# Patient Record
Sex: Female | Born: 1975 | Race: Black or African American | Hispanic: No | Smoking: Former smoker
Health system: Southern US, Community
[De-identification: ages and names within clinical notes are randomized; demographics above are authoritative.]

## PROBLEM LIST (undated history)

## (undated) DIAGNOSIS — D649 Anemia, unspecified: Secondary | ICD-10-CM

## (undated) DIAGNOSIS — R7989 Other specified abnormal findings of blood chemistry: Secondary | ICD-10-CM

## (undated) DIAGNOSIS — F1010 Alcohol abuse, uncomplicated: Secondary | ICD-10-CM

## (undated) DIAGNOSIS — M109 Gout, unspecified: Secondary | ICD-10-CM

## (undated) DIAGNOSIS — E876 Hypokalemia: Secondary | ICD-10-CM

## (undated) DIAGNOSIS — F1210 Cannabis abuse, uncomplicated: Secondary | ICD-10-CM

## (undated) DIAGNOSIS — D573 Sickle-cell trait: Secondary | ICD-10-CM

## (undated) DIAGNOSIS — E871 Hypo-osmolality and hyponatremia: Secondary | ICD-10-CM

## (undated) DIAGNOSIS — I10 Essential (primary) hypertension: Secondary | ICD-10-CM

## (undated) DIAGNOSIS — M1990 Unspecified osteoarthritis, unspecified site: Secondary | ICD-10-CM

## (undated) HISTORY — DX: Essential (primary) hypertension: I10

## (undated) HISTORY — PX: KNEE SURGERY: SHX244

## (undated) HISTORY — DX: Gout, unspecified: M10.9

## (undated) HISTORY — PX: JOINT REPLACEMENT: SHX530

---

## 2004-07-20 ENCOUNTER — Emergency Department: Payer: Self-pay | Admitting: Emergency Medicine

## 2005-05-09 ENCOUNTER — Emergency Department: Payer: Self-pay | Admitting: Emergency Medicine

## 2006-09-08 HISTORY — PX: TOE SURGERY: SHX1073

## 2007-05-18 ENCOUNTER — Emergency Department: Payer: Self-pay | Admitting: Emergency Medicine

## 2007-11-30 ENCOUNTER — Emergency Department: Payer: Self-pay | Admitting: Emergency Medicine

## 2008-09-08 ENCOUNTER — Emergency Department: Payer: Self-pay | Admitting: Emergency Medicine

## 2008-09-08 HISTORY — PX: OTHER SURGICAL HISTORY: SHX169

## 2009-04-14 IMAGING — CR DG ABDOMEN 3V
1 series · 8 of 10 positions shown · non-contrast
Comparison: none

REASON FOR EXAM: Cough, R posterior thoracic pain, RLQ abdominal pain and
"missing IUD"
COMMENTS:   LMP: > one month ago

[Series 1: view not recorded · 0.17mm/px · 8 of 10 slices shown]
[im 1/10]
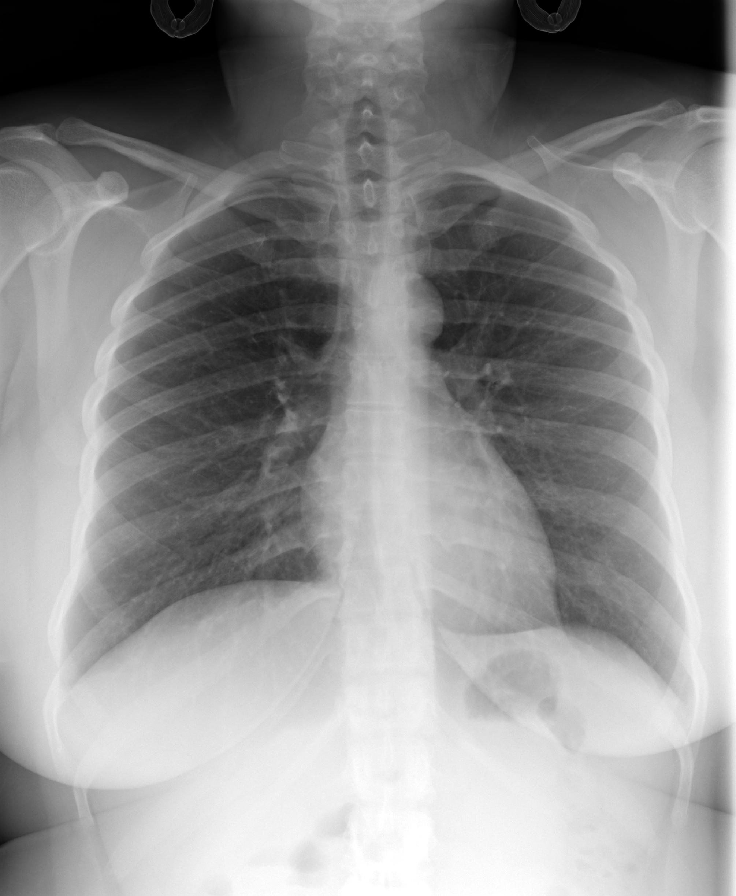
[im 2/10]
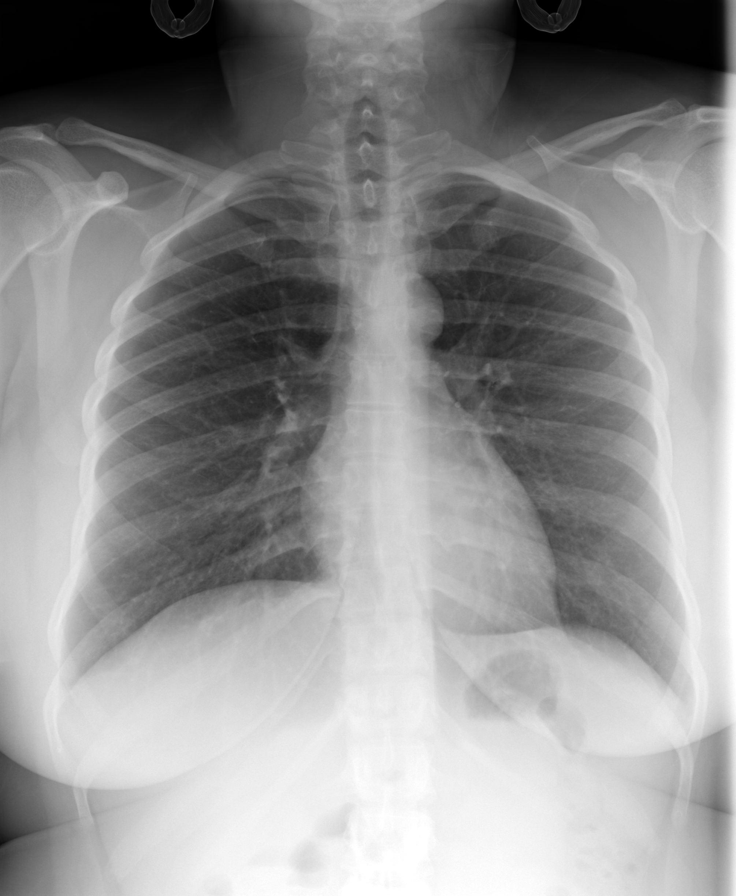
[im 3/10]
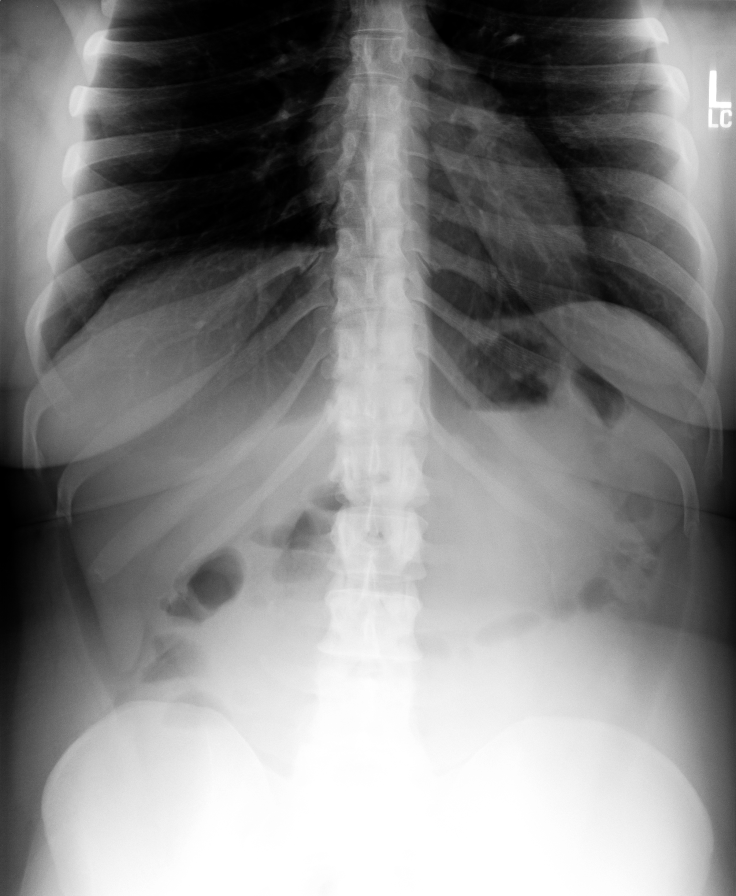
[im 4/10]
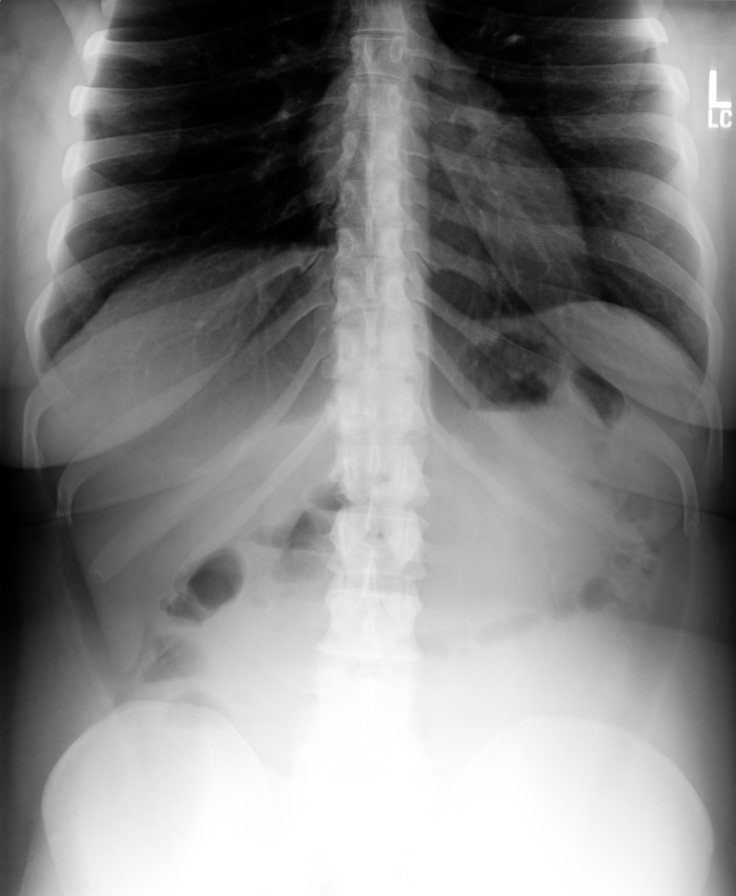
[im 5/10]
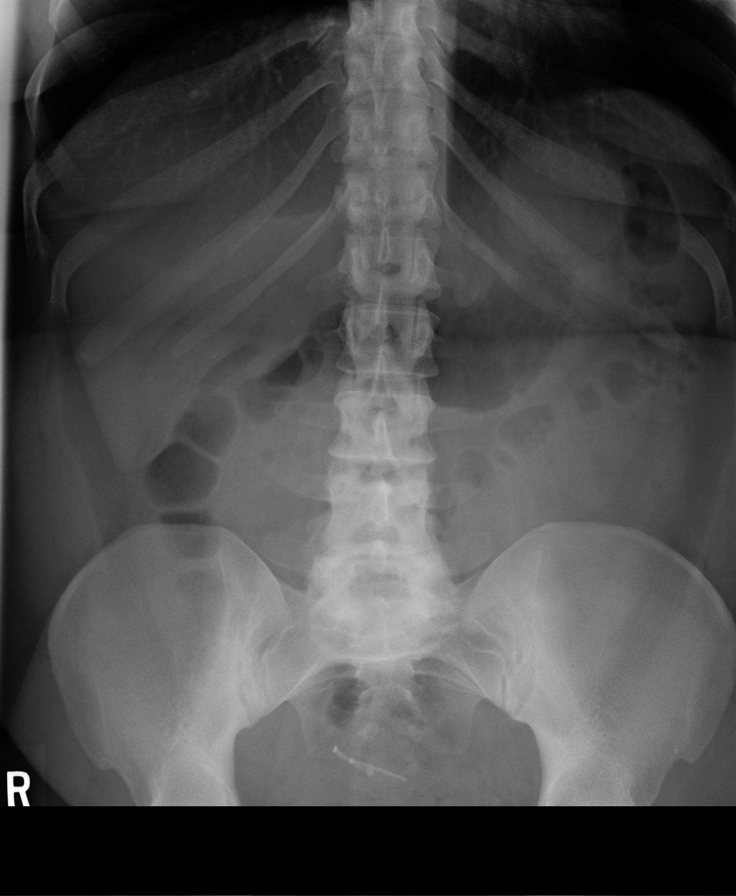
[im 6/10]
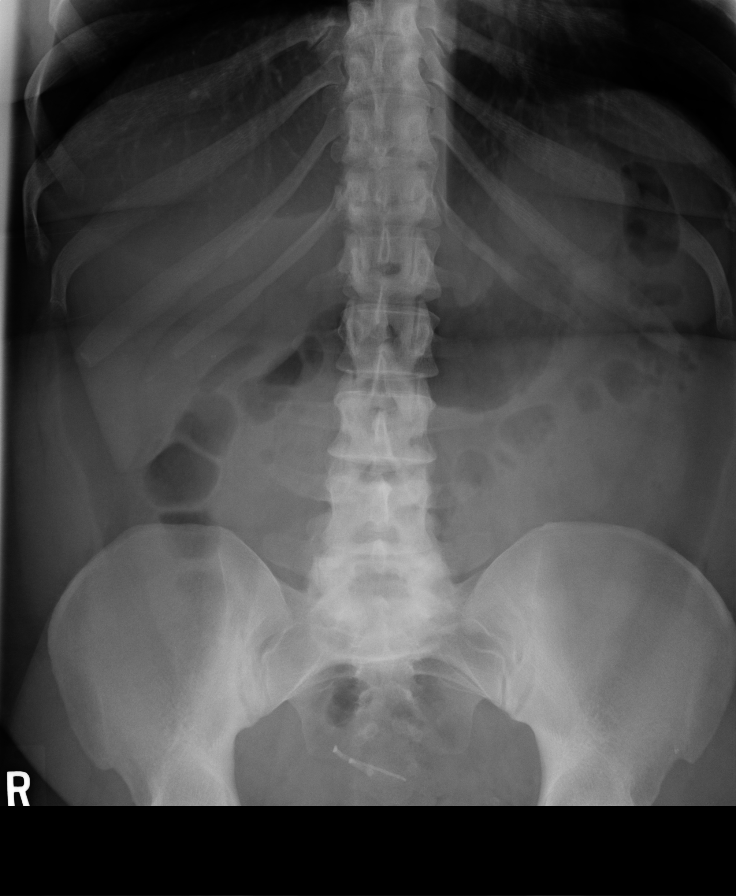
[im 7/10]
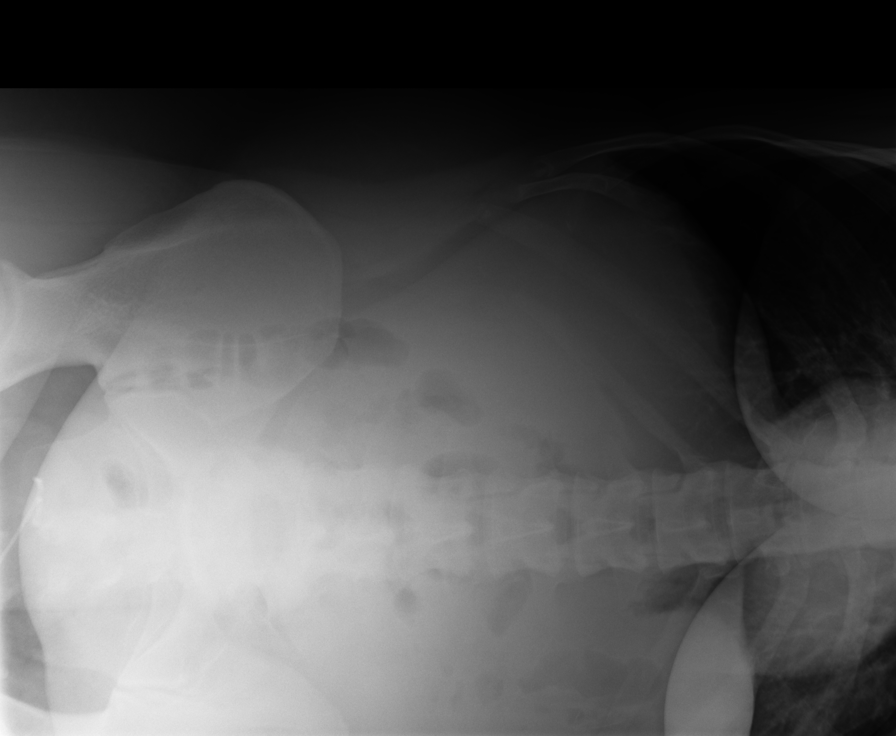
[im 8/10]
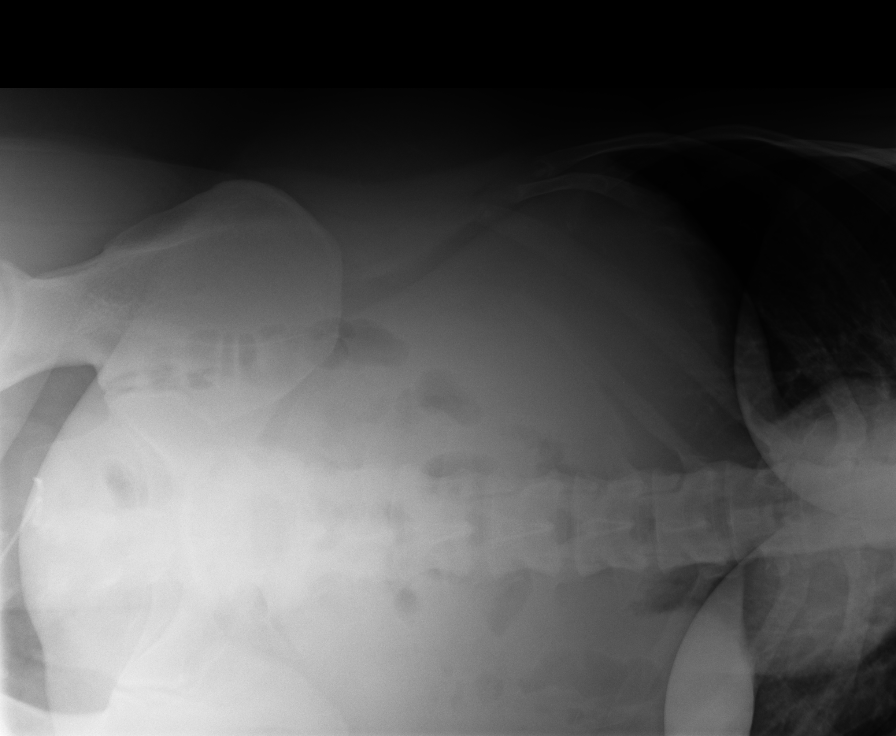

[8 of 10 positions shown; findings below may reference images not displayed]

PROCEDURE:     DXR - DXR ABDOMEN 3-WAY (INCL PA CXR)  - November 30, 2007  [DATE]

RESULT:      The lung fields are clear. The heart size is normal.

Four views of the abdomen were obtained. No subdiaphragmatic free air is
seen. The bowel gas pattern shows no specific abnormalities. There is no
evidence for bowel obstruction. A few small scattered fluid levels are noted
in bowel loops. A phlebolith is noted in the LEFT pelvis. An IUD is present
in the midpelvic area. The osseous structures are normal in appearance.
IMPRESSION: 1. No acute changes are identified.
2. There is no evidence of bowel obstruction. There are noted a few
scattered nonspecific fluid levels within bowel loops.
3. An IUD is present.

## 2009-10-30 ENCOUNTER — Emergency Department: Payer: Self-pay | Admitting: Unknown Physician Specialty

## 2012-08-11 ENCOUNTER — Ambulatory Visit: Payer: Self-pay | Admitting: Family Medicine

## 2013-12-18 ENCOUNTER — Emergency Department: Payer: Self-pay | Admitting: Emergency Medicine

## 2013-12-25 IMAGING — CR RIGHT KNEE - 3 VIEW
1 series · 3 of 3 positions shown · non-contrast
Comparison: none

REASON FOR EXAM: knee pain sprain
COMMENTS:

[Series 1: ap · 0.17mm/px · 3 of 3 slices shown]
[im 1/3]
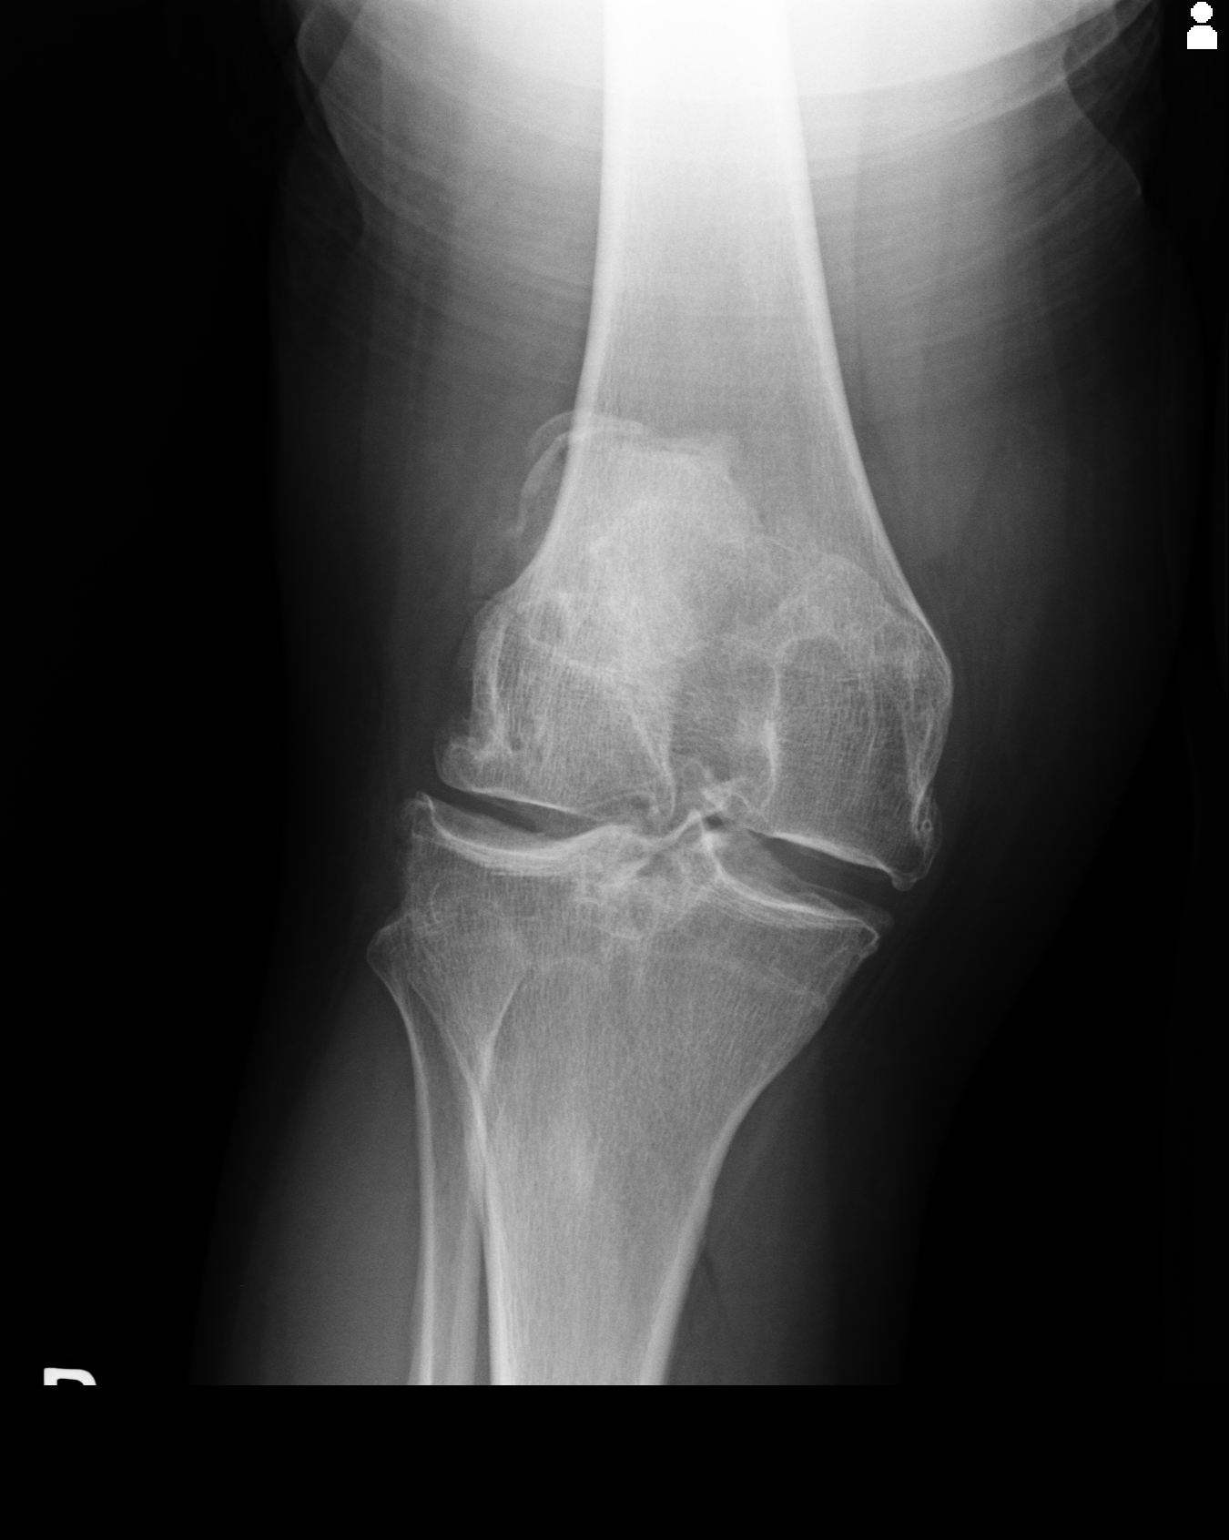
[im 2/3]
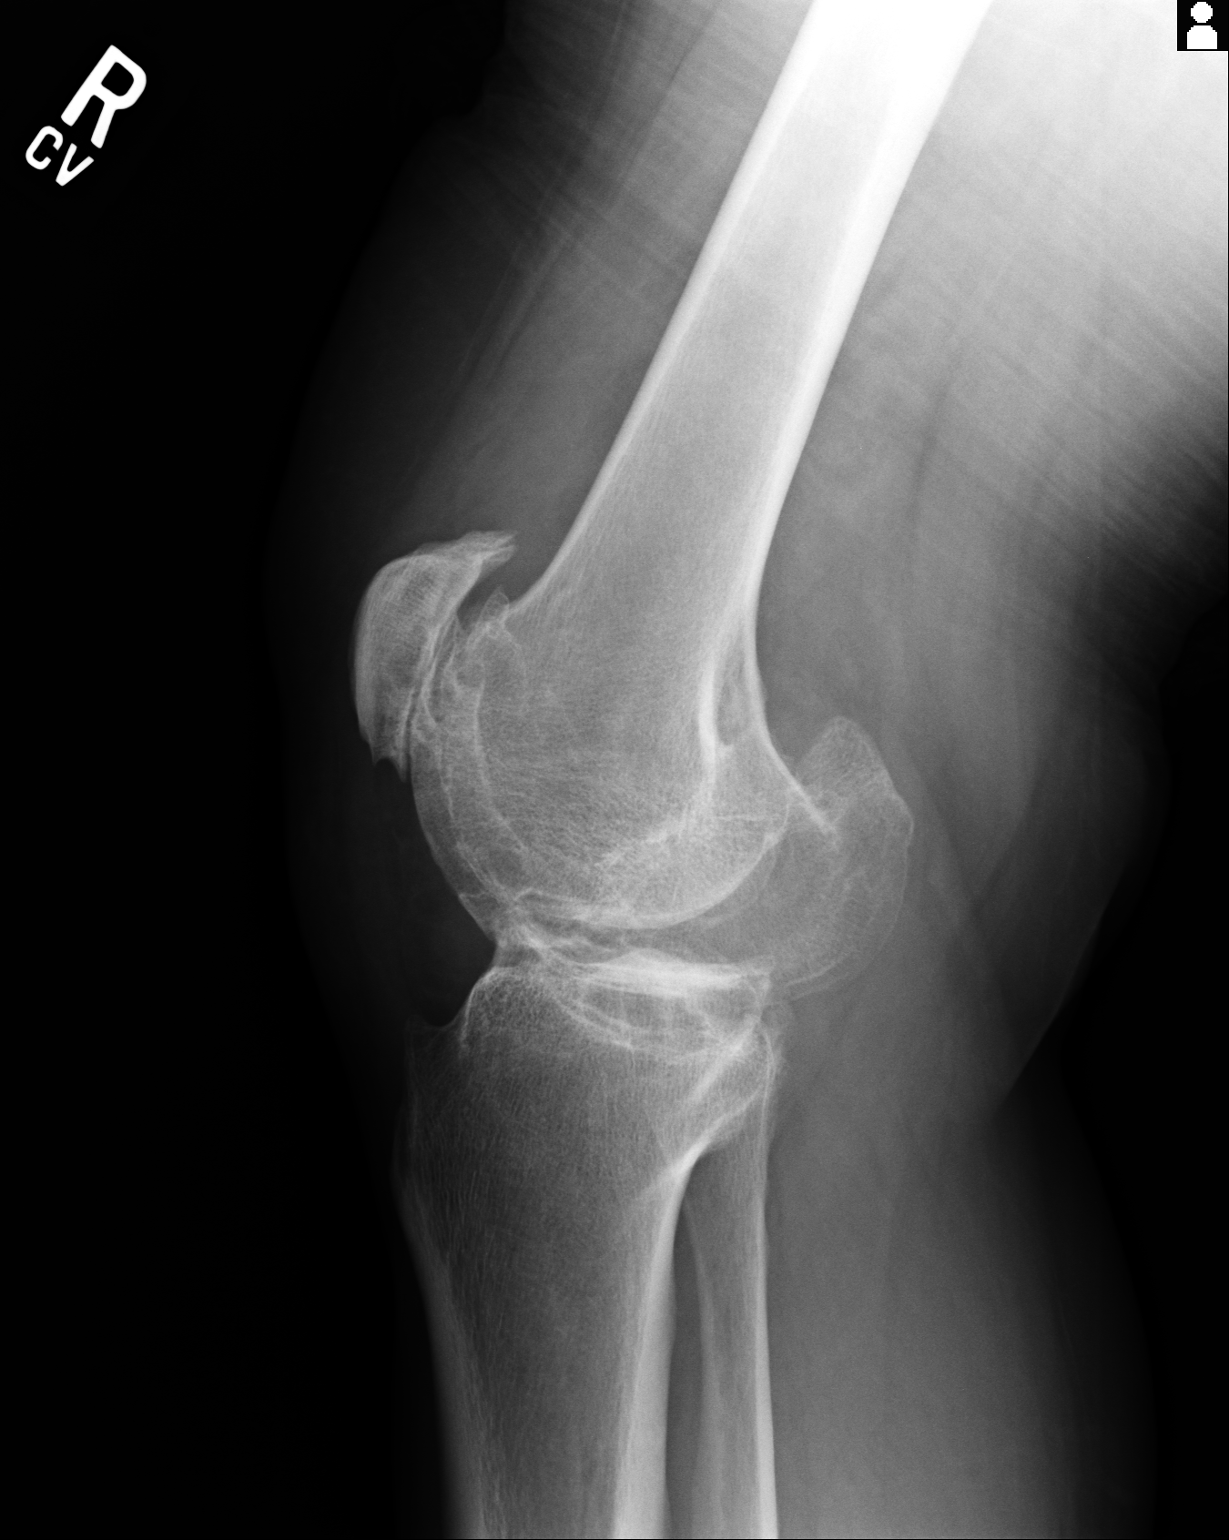
[im 3/3]
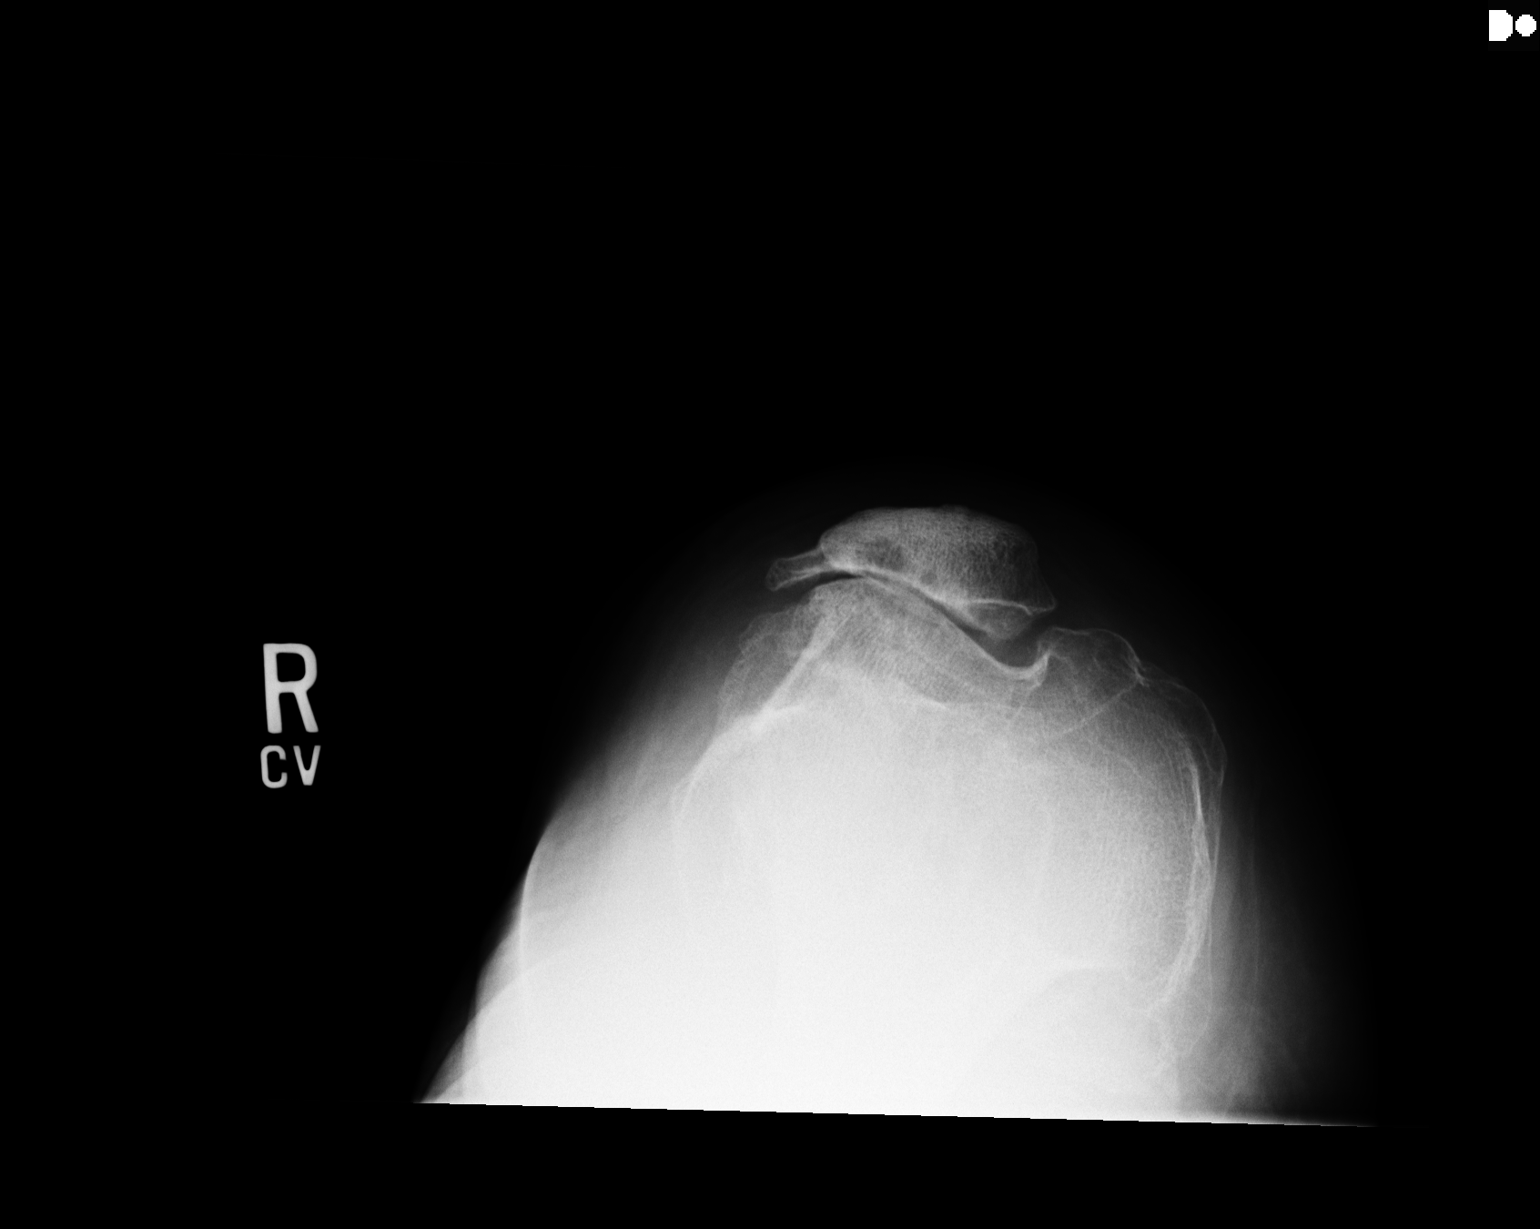

[3 of 3 positions shown; findings below may reference images not displayed]

PROCEDURE:     KDR - KDXR KNEE RT- 3 VIEWS ONLY  - August 11, 2012 [DATE]

RESULT:     Three views of the right knee reveal severe degenerative change
involving the lateral and patellofemoral compartments with moderate
degenerative change of the medial joint compartment. No fracture is
demonstrated. High-grade cartilage loss of the lateral patellar facet is
suspected.
IMPRESSION: There is moderate to severe degenerative change of the
right knee. No acute fracture is demonstrated. Orthopedic evaluation is
recommended.

[REDACTED]

## 2014-01-09 ENCOUNTER — Ambulatory Visit: Payer: Self-pay | Admitting: General Practice

## 2014-04-11 HISTORY — PX: THUMB FUSION: SUR636

## 2015-02-19 ENCOUNTER — Other Ambulatory Visit: Payer: Self-pay | Admitting: Family Medicine

## 2015-05-03 IMAGING — CR DG HAND COMPLETE 3+V*L*
1 series · 1 of 1 positions shown · non-contrast
Comparison: None.

CLINICAL DATA: Pain and swelling in the left thumb after injury
during a softball game.

EXAM:
LEFT HAND - COMPLETE 3+ VIEW

[pa]
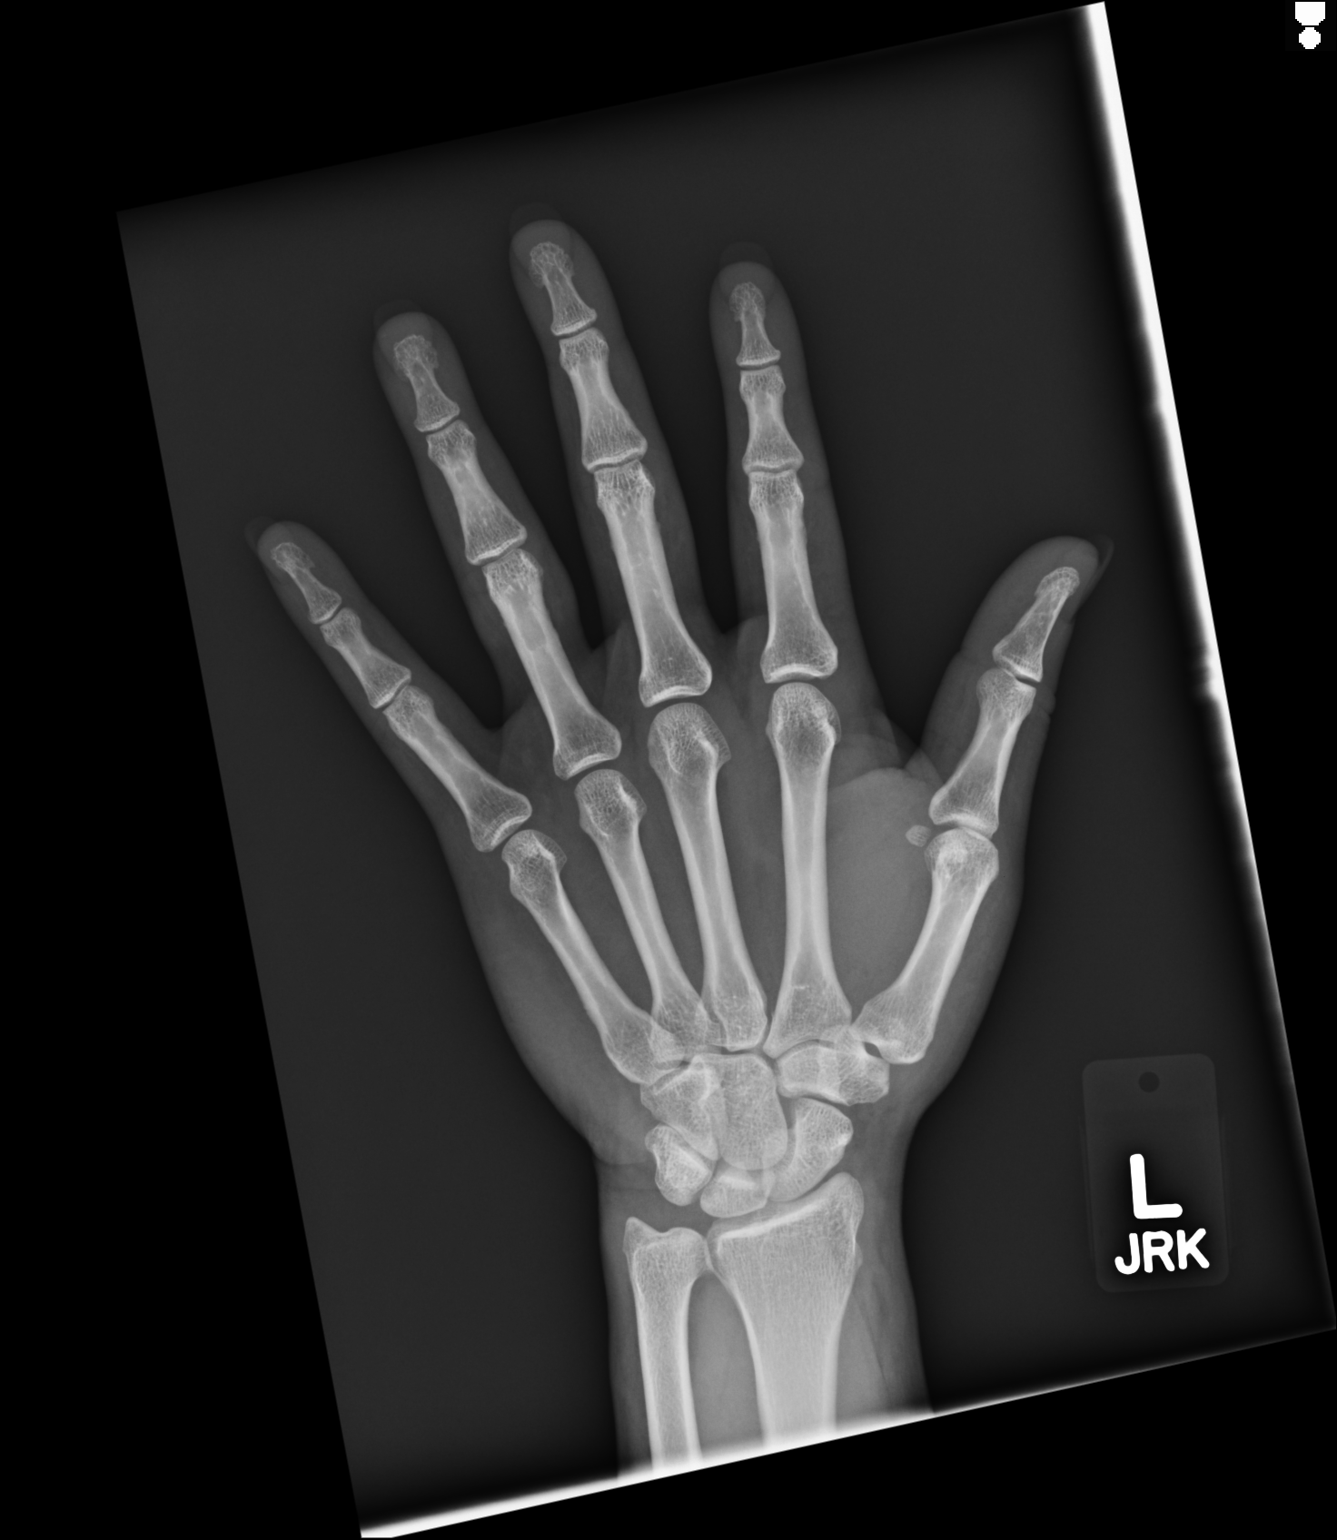

[1 of 1 positions shown; findings below may reference images not displayed]

FINDINGS: There is no evidence of fracture or dislocation. There is no
evidence of arthropathy or other focal bone abnormality. Soft
tissues are unremarkable.
IMPRESSION: Negative.

## 2015-05-29 ENCOUNTER — Ambulatory Visit (INDEPENDENT_AMBULATORY_CARE_PROVIDER_SITE_OTHER): Payer: BC Managed Care – PPO | Admitting: Family Medicine

## 2015-05-29 ENCOUNTER — Encounter: Payer: Self-pay | Admitting: Family Medicine

## 2015-05-29 VITALS — BP 118/84 | HR 79 | Temp 98.4°F | Resp 16 | Ht 61.0 in | Wt 179.4 lb

## 2015-05-29 DIAGNOSIS — I10 Essential (primary) hypertension: Secondary | ICD-10-CM | POA: Insufficient documentation

## 2015-05-29 DIAGNOSIS — B349 Viral infection, unspecified: Secondary | ICD-10-CM

## 2015-05-29 MED ORDER — HYDROCODONE-HOMATROPINE 5-1.5 MG/5ML PO SYRP: ORAL_SOLUTION | ORAL | Status: DC

## 2015-05-29 NOTE — Progress Notes (Signed)
Subjective:     Patient ID: Kara Murray, female   DOB: 29-Nov-1975, 39 y.o.   MRN: [id number]  HPI  Chief Complaint  Patient presents with  . Sinus Problem    Patient comes in office today with complaints of sinus pain and pressure for the past 24 hrs, patient reports pressure is around her head. Patient has productive cough with yellow phlegm, she reports taking otc cold medication last night.     Review of Systems  Constitutional: Negative for fever.       No body aches  HENT: Positive for sore throat.   Gastrointestinal: Positive for diarrhea.       Objective:   Physical Exam  Constitutional: She appears well-developed. No distress (frequent dry cough).  Ears: T.M's intact without inflammation Throat: no posterior pharyngeal erythema, tonsils absent Neck: no cervical adenopathy Lungs: clear     Assessment:    1. Viral syndrome - HYDROcodone-homatropine (HYCODAN) 5-1.5 MG/5ML syrup; 5 ml 4-6 hours as needed for cough  Dispense: 240 mL; Refill: 0    Plan:    Discussed use of Mucinex D for congestion, Delsym for cough, and Benadryl for postnasal drainage

## 2015-05-29 NOTE — Patient Instructions (Signed)
Discussed use of Mucinex D for congestion, Delsym for cough, and Benadryl for postnasal drainage 

## 2015-08-07 ENCOUNTER — Ambulatory Visit: Payer: BC Managed Care – PPO | Admitting: Family Medicine

## 2015-08-07 ENCOUNTER — Encounter: Payer: Self-pay | Admitting: Family Medicine

## 2015-08-07 VITALS — BP 130/102 | HR 79 | Temp 98.6°F | Resp 16 | Wt 179.6 lb

## 2015-08-07 DIAGNOSIS — I10 Essential (primary) hypertension: Secondary | ICD-10-CM

## 2015-08-07 NOTE — Patient Instructions (Signed)
Formal office visit in 3 weeks. Please take your medication daily.

## 2015-08-07 NOTE — Progress Notes (Signed)
Patient ID: Kara BondCatherine L Williams, female   DOB: [birth year], 39 y.o.   MRN: [id number] States she did not take her medication while on vacation last week. Has been back on it for the last two days. Suggested we treat this visit as a nurse bp check and see her again in 3 weeks when she is back on medication regularly.

## 2015-08-09 ENCOUNTER — Emergency Department: Payer: BC Managed Care – PPO

## 2015-08-09 ENCOUNTER — Emergency Department
Admission: EM | Admit: 2015-08-09 | Discharge: 2015-08-09 | Disposition: A | Discharge status: Home / Self Care | Payer: BC Managed Care – PPO | Attending: Emergency Medicine | Admitting: Emergency Medicine

## 2015-08-09 ENCOUNTER — Encounter: Payer: Self-pay | Admitting: *Deleted

## 2015-08-09 DIAGNOSIS — R1013 Epigastric pain: Secondary | ICD-10-CM | POA: Diagnosis not present

## 2015-08-09 DIAGNOSIS — R109 Unspecified abdominal pain: Secondary | ICD-10-CM

## 2015-08-09 DIAGNOSIS — R112 Nausea with vomiting, unspecified: Secondary | ICD-10-CM | POA: Diagnosis not present

## 2015-08-09 DIAGNOSIS — Z79899 Other long term (current) drug therapy: Secondary | ICD-10-CM | POA: Insufficient documentation

## 2015-08-09 DIAGNOSIS — I10 Essential (primary) hypertension: Secondary | ICD-10-CM | POA: Diagnosis not present

## 2015-08-09 DIAGNOSIS — Z3202 Encounter for pregnancy test, result negative: Secondary | ICD-10-CM | POA: Diagnosis not present

## 2015-08-09 DIAGNOSIS — R1010 Upper abdominal pain, unspecified: Secondary | ICD-10-CM | POA: Diagnosis present

## 2015-08-09 LAB — COMPREHENSIVE METABOLIC PANEL
ALT: 22 U/L (ref 14–54)
AST: 34 U/L (ref 15–41)
Albumin: 4.3 g/dL (ref 3.5–5.0)
Alkaline Phosphatase: 35 U/L — ABNORMAL LOW (ref 38–126)
Anion gap: 10 (ref 5–15)
BUN: 7 mg/dL (ref 6–20)
CO2: 22 mmol/L (ref 22–32)
Calcium: 9.1 mg/dL (ref 8.9–10.3)
Chloride: 104 mmol/L (ref 101–111)
Creatinine, Ser: 0.72 mg/dL (ref 0.44–1.00)
GFR calc Af Amer: >60 mL/min (ref >60)
GFR calc non Af Amer: >60 mL/min (ref >60)
Glucose, Bld: 86 mg/dL (ref 65–99)
Potassium: 4.1 mmol/L (ref 3.5–5.1)
Sodium: 136 mmol/L (ref 135–145)
Total Bilirubin: 0.3 mg/dL (ref 0.3–1.2)
Total Protein: 7.7 g/dL (ref 6.5–8.1)

## 2015-08-09 LAB — URINALYSIS COMPLETE WITH MICROSCOPIC (ARMC ONLY)
Bacteria, UA: NONE SEEN
Bilirubin Urine: NEGATIVE
Glucose, UA: NEGATIVE mg/dL
Hgb urine dipstick: NEGATIVE
Ketones, ur: NEGATIVE mg/dL
Leukocytes, UA: NEGATIVE
Nitrite: NEGATIVE
Protein, ur: NEGATIVE mg/dL
Specific Gravity, Urine: 1.004 — ABNORMAL LOW (ref 1.005–1.030)
pH: 7 (ref 5.0–8.0)

## 2015-08-09 LAB — CBC
HCT: 37.5 % (ref 35.0–47.0)
Hemoglobin: 13 g/dL (ref 12.0–16.0)
MCH: 31 pg (ref 26.0–34.0)
MCHC: 34.6 g/dL (ref 32.0–36.0)
MCV: 89.5 fL (ref 80.0–100.0)
Platelets: 230 10*3/uL (ref 150–440)
RBC: 4.19 MIL/uL (ref 3.80–5.20)
RDW: 14.7 % — ABNORMAL HIGH (ref 11.5–14.5)
WBC: 5.3 10*3/uL (ref 3.6–11.0)

## 2015-08-09 LAB — POCT PREGNANCY, URINE: Preg Test, Ur: NEGATIVE

## 2015-08-09 LAB — TROPONIN I: Troponin I: <0.03 ng/mL (ref <0.031)

## 2015-08-09 LAB — LIPASE, BLOOD: Lipase: 55 U/L — ABNORMAL HIGH (ref 11–51)

## 2015-08-09 MED ORDER — ONDANSETRON 4 MG PO TBDP: 4.0000 mg | ORAL_TABLET | Freq: Four times a day (QID) | ORAL | Status: DC | PRN

## 2015-08-09 MED ORDER — TRAMADOL HCL 50 MG PO TABS
50.0000 mg | ORAL_TABLET | Freq: Once | ORAL | Status: AC
  Administered 2015-08-09: 50 mg via ORAL
  Filled 2015-08-09: qty 1

## 2015-08-09 MED ORDER — TRAMADOL HCL 50 MG PO TABS: 50.0000 mg | ORAL_TABLET | Freq: Four times a day (QID) | ORAL | Status: DC | PRN

## 2015-08-09 NOTE — Discharge Instructions (Signed)
You were seen in the emergency room for abdominal pain. It is important that you follow up closely with your primary care doctor in the next couple of days. ° °If you're unable to see her primary care doctor you may return to the emergency room or go to the [facility] walk-in clinic in 1 or 2 days for reexam. ° °Please return to the emergency room right away if you are to develop a fever, severe nausea, your pain becomes severe or worsens, you are unable to keep food down, begin vomiting any dark or bloody fluid, you develop any dark or bloody stools, feel dehydrated, or other new concerns or symptoms arise. ° ° °Abdominal Pain, Adult °Many things can cause abdominal pain. Usually, abdominal pain is not caused by a disease and will improve without treatment. It can often be observed and treated at home. Your health care provider will do a physical exam and possibly order blood tests and X-rays to help determine the seriousness of your pain. However, in many cases, more time must pass before a clear cause of the pain can be found. Before that point, your health care provider may not know if you need more testing or further treatment. °HOME CARE INSTRUCTIONS °Monitor your abdominal pain for any changes. The following actions may help to alleviate any discomfort you are experiencing: °· Only take over-the-counter or prescription medicines as directed by your health care provider. °· Do not take laxatives unless directed to do so by your health care provider. °· Try a clear liquid diet (broth, tea, or water) as directed by your health care provider. Slowly move to a bland diet as tolerated. °SEEK MEDICAL CARE IF: °· You have unexplained abdominal pain. °· You have abdominal pain associated with nausea or diarrhea. °· You have pain when you urinate or have a bowel movement. °· You experience abdominal pain that wakes you in the night. °· You have abdominal pain that is worsened or improved by eating food. °· You have  abdominal pain that is worsened with eating fatty foods. °· You have a fever. °SEEK IMMEDIATE MEDICAL CARE IF: °· Your pain does not go away within 2 hours. °· You keep throwing up (vomiting). °· Your pain is felt only in portions of the abdomen, such as the right side or the left lower portion of the abdomen. °· You pass bloody or black tarry stools. °MAKE SURE YOU: °· Understand these instructions. °· Will watch your condition. °· Will get help right away if you are not doing well or get worse. °  °This information is not intended to replace advice given to you by your health care provider. Make sure you discuss any questions you have with your health care provider. °  °Document Released: 06/04/2005 Document Revised: 05/16/2015 Document Reviewed: 05/04/2013 °Elsevier Interactive Patient Education ©2016 [employer] ° °

## 2015-08-09 NOTE — ED Notes (Signed)
Mid back pain and radiation to ruq abd pain, n/v

## 2015-08-09 NOTE — ED Notes (Signed)
Pt states sudden onset of mid back pain radiating to her mid abd, states she vomited several times day, states she feels like her stomach is bubbling and threw up all the food she ate today, pt in no acute distress

## 2015-08-09 NOTE — ED Provider Notes (Signed)
[facility] Emergency Department Provider Note REMINDER - THIS NOTE IS NOT A FINAL MEDICAL RECORD UNTIL IT IS SIGNED. UNTIL THEN, THE CONTENT BELOW MAY REFLECT INFORMATION FROM A DOCUMENTATION TEMPLATE, NOT THE ACTUAL PATIENT VISIT. ____________________________________________  Time seen: Approximately 3:33 PM  I have reviewed the triage vital signs and the nursing notes.   HISTORY  Chief Complaint Abdominal Pain    HPI Kara Murray is a 39 y.o. female history hypertension.  He reports when she up this morning started having some mild discomfort in her mid back, and then throughout the workday she started developing increasing pain in the upper abdomen that radiated to the back. She vomited while at work, was feeling quite nauseated and the pain was relatively severe at that time.  The patient reports that she went home, and her pain began to ease up. She came to the ER for further evaluation. At the present time she reports no nausea, no abdominal pain and her symptoms are resolved.  She does have a previous history of hypertension. She denies pregnancy. She has no known drug allergies. She has not had chest pain or trouble breathing. No pain in the lower abdomen. No vaginal discomfort or pelvic pain.  No pain or burning with urination.   She has had previous bariatric surgery approximately 6 years ago.  Past Medical History  Diagnosis Date  . Hypertension     Patient Active Problem List   Diagnosis Date Noted  . Hypertension 05/29/2015    Past Surgical History  Procedure Laterality Date  . Knee surgery  1994, 2001  . Toe surgery  2008  . Biariactric surgery  2010  . Thumb fusion Left 04/11/14    Current Outpatient Rx  Name  Route  Sig  Dispense  Refill  . amLODipine (NORVASC) 5 MG tablet   Oral   Take 5 mg by mouth daily.         Marland Kitchen. atenolol-chlorthalidone (TENORETIC) 50-25 MG per tablet      TAKE 1 TABLET BY MOUTH EVERY DAY   30 tablet   5     Allergies Review of patient's allergies indicates no known allergies.  Family History  Problem Relation Age of Onset  . Hypertension Mother   . Diabetes Mother   . Heart attack Father   . Hypertension Brother     Social History Social History  Substance Use Topics  . Smoking status: Never Smoker   . Smokeless tobacco: Never Used  . Alcohol Use: None    Review of Systems Constitutional: No fever/chills Eyes: No visual changes. ENT: No sore throat. Cardiovascular: Denies chest pain. Respiratory: Denies shortness of breath. Gastrointestinal:  No diarrhea.  No constipation. Genitourinary: Negative for dysuria. Musculoskeletal: Negative for back pain septum which seemed to shoot from the upper mid abdomen. Skin: Negative for rash. Neurological: Negative for headaches, focal weakness or numbness.  10-point ROS otherwise negative.  ____________________________________________   PHYSICAL EXAM:  VITAL SIGNS: ED Triage Vitals  Enc Vitals Group     BP 08/09/15 1206 165/107 mmHg     Pulse Rate 08/09/15 1204 90     Resp 08/09/15 1204 20     Temp 08/09/15 1204 97.9 F (36.6 C)     Temp Source 08/09/15 1204 Oral     SpO2 08/09/15 1204 100 %     Weight 08/09/15 1204 176 lb (79.833 kg)     Height 08/09/15 1204 5\' 1"  (1.549 m)     Head  Cir --      Peak Flow --      Pain Score 08/09/15 1205 10     Pain Loc --      Pain Edu? --      Excl. in GC? --    Constitutional: Alert and oriented. Well appearing and in no acute distress. Eyes: Conjunctivae are normal. PERRL. EOMI. Head: Atraumatic. Nose: No congestion/rhinnorhea. Mouth/Throat: Mucous membranes are moist.  Oropharynx non-erythematous. Neck: No stridor.   Cardiovascular: Normal rate, regular rhythm. Grossly normal heart sounds.  Good peripheral circulation. Respiratory: Normal respiratory effort.  No retractions. Lungs CTAB. Gastrointestinal: Soft and nontender. No distention. No abdominal  bruits. No CVA tenderness. Negative Murphy. Musculoskeletal: No lower extremity tenderness nor edema.  No joint effusions. Neurologic:  Normal speech and language. No gross focal neurologic deficits are appreciated. No gait instability. Skin:  Skin is warm, dry and intact. No rash noted. Psychiatric: Mood and affect are normal. Speech and behavior are normal.  Offered patient medication for discomfort and pain, however she reports that all symptoms are resolved and did not wish any pain medication. ____________________________________________   LABS (all labs ordered are listed, but only abnormal results are displayed)  Labs Reviewed  LIPASE, BLOOD - Abnormal; Notable for the following:    Lipase 55 (*)    All other components within normal limits  COMPREHENSIVE METABOLIC PANEL - Abnormal; Notable for the following:    Alkaline Phosphatase 35 (*)    All other components within normal limits  CBC - Abnormal; Notable for the following:    RDW 14.7 (*)    All other components within normal limits  URINALYSIS COMPLETEWITH MICROSCOPIC ([facility] ONLY) - Abnormal; Notable for the following:    Color, Urine STRAW (*)    APPearance CLEAR (*)    Specific Gravity, Urine 1.004 (*)    Squamous Epithelial / LPF 0-5 (*)    All other components within normal limits  TROPONIN I  POC URINE PREG, ED  POCT PREGNANCY, URINE   ____________________________________________  EKG  ED ECG REPORT I, QUALE, MARK, the attending physician, personally viewed and interpreted this ECG.  Date: 08/09/2015 EKG Time: 1210 Rate: 90 Rhythm: normal sinus rhythm QRS Axis: normal Intervals: normal ST/T Wave abnormalities: normal Conduction Disutrbances: none Narrative Interpretation: unremarkable  ____________________________________________  RADIOLOGY  US Abdomen Limited RUQ (Final result) Result time: 08/09/15 15:50:38   Final result by Rad Results In Interface (08/09/15 15:50:38)   Narrative:    CLINICAL DATA: Complaining of epigastric pain and nausea and vomiting today.  EXAM: US ABDOMEN LIMITED - RIGHT UPPER QUADRANT  COMPARISON: None.  FINDINGS: Gallbladder:  No gallstones or wall thickening visualized. No sonographic Murphy sign noted.  Common bile duct:  Diameter: 3.3 mm  Liver:  No focal lesion identified. Within normal limits in parenchymal echogenicity.  IMPRESSION: Normal right upper quadrant ultrasound.     ____________________________________________   PROCEDURES  Procedure(s) performed: None  Critical Care performed: No  ____________________________________________   INITIAL IMPRESSION / ASSESSMENT AND PLAN / ED COURSE  Pertinent labs & imaging results that were available during my care of the patient were reviewed by me and considered in my medical decision making (see chart for details).  Patient presents with back pain radiating to the abdomen, associated nausea and vomiting once today. No bloody emesis. Sinus black or bloody diarrhea. At the present time her abdominal exam is very reassuring. Negative Murphy, no right lower quadrant pain. No rebound or guarding. Her pain and symptoms  have all resolved at this time.  In the setting of her age and symptomatology, I do believe there is a possibility for mild pancreatitis, cholecystitis or cholelithiasis, biliary. Given the resolution of her symptoms I do not believe this represents a acute perforation of an ulcer, complication from previous bariatric surgery. She has no peritonitis on exam. No chest pain or pulmonary symptoms.  We will obtain ultrasound and reevaluate. She has recurrent or persistent pain and would consider CT, if she remains asymptomatic and likely disposition after [place] completed.  UA ____________________________________________  ----------------------------------------- 4:09 PM on 08/09/2015 -----------------------------------------  Patient with normal right upper  quadrant ultrasound. She reports she has just very mild discomfort in that the pain has eased off a lot. She's been walking, states she has not had any worsening discomfort.  I discussed with the patient the risks and benefits of abdominal CT scan. The present time there is no clear indication that the patient requires CT, the patient does have an abdominal complaint but exam does not suggest acute surgical abdomen and my suspicion for intra-abdominal infection including appendicitis, cholecystitis, aaa, dissection, ischemia, perforation, pancreatitis, diverticulitis or other acute major intra-abdominal process is quite low. After discussing the risks and benefits including benefits of additional evaluation for diagnoses, ruling out infection/perforation/aaa/etc, but also discussing the risks including low, "well less than 1%," but not 0 risk of inducing cancers due to radiation and potential risks of contrast the patient indicated via our shared medical decision-making that she would not do a CAT scan. Rather if the patient does have worsening symptoms, develops a high fever, develops pain or persistent discomfort in the right upper quadrant or right lower quadrant, or other new concerns arise they will come back to emergency room right away. As the patient's clinician I think this is a very reasonable decision having discussed general risks and benefits of CT, and my clinical suspicion that CT would be of benefit at this time is very low.  We will provide her a short prescription for tramadol and Zofran. Should her symptoms come back and be severe, she had a fever, significant worsening, return of nausea vomiting or other new concerns or return to emergency room right away.  She does drink about 4-5 alcoholic drinks each night, denies a history of any withdrawal symptoms. Mild alcoholic pancreatitis is certainly a consideration at this time. We discussed decreasing in stopping her alcohol, patient agreeable  and understands my instructions.  I will prescribe the patient a narcotic pain medicine due to their condition which I anticipate will cause at least moderate pain short term. I discussed with the patient safe use of narcotic pain medicines, and that they are not to drive, work in dangerous areas, or ever take more than prescribed (no more than 1 pill every 6 hours). We discussed that this is the type of medication that "Criss Alvine" may have overdosed on and the risks of this type of medicine. Patient is very agreeable to only use as prescribed and to never use more than prescribed.    FINAL CLINICAL IMPRESSION(S) / ED DIAGNOSES  Final diagnoses:  Abdominal pain  Epigastric abdominal pain      Sharyn Creamer, MD 08/09/15 [phone]

## 2015-08-14 ENCOUNTER — Other Ambulatory Visit: Payer: Self-pay | Admitting: *Deleted

## 2015-08-14 NOTE — Telephone Encounter (Signed)
Requesting 90 day supply.

## 2015-08-15 MED ORDER — ATENOLOL-CHLORTHALIDONE 50-25 MG PO TABS: 1.0000 | ORAL_TABLET | Freq: Every day | ORAL | Status: DC

## 2015-08-28 ENCOUNTER — Ambulatory Visit: Payer: BC Managed Care – PPO | Admitting: Family Medicine

## 2016-04-27 ENCOUNTER — Other Ambulatory Visit: Payer: Self-pay | Admitting: Family Medicine

## 2016-05-29 ENCOUNTER — Ambulatory Visit (INDEPENDENT_AMBULATORY_CARE_PROVIDER_SITE_OTHER): Payer: PRIVATE HEALTH INSURANCE | Admitting: Family Medicine

## 2016-05-29 ENCOUNTER — Encounter: Payer: Self-pay | Admitting: Family Medicine

## 2016-05-29 VITALS — BP 120/84 | HR 77 | Temp 98.5°F | Resp 16 | Ht 61.0 in | Wt 187.0 lb

## 2016-05-29 DIAGNOSIS — J069 Acute upper respiratory infection, unspecified: Secondary | ICD-10-CM | POA: Diagnosis not present

## 2016-05-29 MED ORDER — HYDROCODONE-HOMATROPINE 5-1.5 MG/5ML PO SYRP: ORAL_SOLUTION | ORAL | 0 refills | Status: DC

## 2016-05-29 NOTE — Progress Notes (Signed)
Subjective:     Patient ID: Kara BondCatherine L Murray, female   DOB: [birth year], 40 y.o.   MRN: [id number]  HPI  Chief Complaint  Patient presents with  . Sinusitis  Developed cough and sinus congestion 3 days ago. 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   Review of Systems  Constitutional: Negative for chills and fever.       Objective:   Physical Exam  Constitutional: She appears well-developed and well-nourished.  Ears:Left TM intact without inflammation. Right TM obscured by cerumen. Throat: no tonsillar enlargement or exudate Neck: mildly tender anterior cervical area without significant swelling Lungs: clear     Assessment:    1. Upper respiratory infection - HYDROcodone-homatropine (HYCODAN) 5-1.5 MG/5ML syrup; 5 ml 4-6 hours as needed for cough  Dispense: 240 mL; Refill: 0    Plan:    Discussed use of Mucinex D for congestion, Delsym for cough, and Benadryl for postnasal drainage   Work excuse of 9/21-9/22/17.

## 2016-05-29 NOTE — Patient Instructions (Signed)
Discussed use of Mucinex D for congestion, Delsym for cough, and Benadryl for postnasal drainage 

## 2016-08-26 ENCOUNTER — Other Ambulatory Visit: Payer: Self-pay | Admitting: Family Medicine

## 2016-08-26 DIAGNOSIS — I10 Essential (primary) hypertension: Secondary | ICD-10-CM

## 2016-08-26 MED ORDER — ATENOLOL-CHLORTHALIDONE 50-25 MG PO TABS: 1.0000 | ORAL_TABLET | Freq: Every day | ORAL | 0 refills | Status: DC

## 2016-11-29 ENCOUNTER — Other Ambulatory Visit: Payer: Self-pay | Admitting: Family Medicine

## 2016-11-29 DIAGNOSIS — I10 Essential (primary) hypertension: Secondary | ICD-10-CM

## 2017-01-05 ENCOUNTER — Other Ambulatory Visit: Payer: Self-pay | Admitting: Family Medicine

## 2017-03-03 ENCOUNTER — Other Ambulatory Visit: Payer: Self-pay | Admitting: Family Medicine

## 2017-03-16 ENCOUNTER — Other Ambulatory Visit: Payer: Self-pay | Admitting: Family Medicine

## 2017-03-16 DIAGNOSIS — I10 Essential (primary) hypertension: Secondary | ICD-10-CM

## 2017-03-16 MED ORDER — CHLORTHALIDONE 25 MG PO TABS: 25.0000 mg | ORAL_TABLET | Freq: Every day | ORAL | 0 refills | Status: DC

## 2017-03-16 MED ORDER — ATENOLOL 50 MG PO TABS: 50.0000 mg | ORAL_TABLET | Freq: Every day | ORAL | 0 refills | Status: DC

## 2017-09-29 ENCOUNTER — Other Ambulatory Visit: Payer: Self-pay | Admitting: Family Medicine

## 2017-09-29 DIAGNOSIS — I10 Essential (primary) hypertension: Secondary | ICD-10-CM

## 2017-11-02 ENCOUNTER — Telehealth: Payer: Self-pay | Admitting: Family Medicine

## 2017-11-02 NOTE — Telephone Encounter (Signed)
Open In Error.

## 2017-11-02 NOTE — Telephone Encounter (Signed)
Patient is way over due for an office visit, last HTN appt was 05/29/15, I advised patient that she needed to make appt prior to medication refill and she has scheduled a follow up with you on 11/13/17. KW

## 2017-11-02 NOTE — Telephone Encounter (Signed)
[facility] pharmacy faxed a refill request for a 90-days supply for the following medication. Thanks CC ° °amLODipine (NORVASC) 5 MG tablet  ° °

## 2017-11-08 ENCOUNTER — Other Ambulatory Visit: Payer: Self-pay

## 2017-11-08 ENCOUNTER — Encounter: Payer: Self-pay | Admitting: Emergency Medicine

## 2017-11-08 ENCOUNTER — Emergency Department
Admission: EM | Admit: 2017-11-08 | Discharge: 2017-11-08 | Disposition: A | Discharge status: Home / Self Care | Payer: BC Managed Care – PPO | Attending: Emergency Medicine | Admitting: Emergency Medicine

## 2017-11-08 DIAGNOSIS — S81011A Laceration without foreign body, right knee, initial encounter: Secondary | ICD-10-CM | POA: Diagnosis not present

## 2017-11-08 DIAGNOSIS — W268XXA Contact with other sharp object(s), not elsewhere classified, initial encounter: Secondary | ICD-10-CM | POA: Diagnosis not present

## 2017-11-08 DIAGNOSIS — Z23 Encounter for immunization: Secondary | ICD-10-CM | POA: Insufficient documentation

## 2017-11-08 DIAGNOSIS — Y999 Unspecified external cause status: Secondary | ICD-10-CM | POA: Diagnosis not present

## 2017-11-08 DIAGNOSIS — I10 Essential (primary) hypertension: Secondary | ICD-10-CM | POA: Insufficient documentation

## 2017-11-08 DIAGNOSIS — Z79899 Other long term (current) drug therapy: Secondary | ICD-10-CM | POA: Insufficient documentation

## 2017-11-08 DIAGNOSIS — Y9301 Activity, walking, marching and hiking: Secondary | ICD-10-CM | POA: Insufficient documentation

## 2017-11-08 DIAGNOSIS — S8991XA Unspecified injury of right lower leg, initial encounter: Secondary | ICD-10-CM | POA: Diagnosis present

## 2017-11-08 DIAGNOSIS — Y92009 Unspecified place in unspecified non-institutional (private) residence as the place of occurrence of the external cause: Secondary | ICD-10-CM | POA: Insufficient documentation

## 2017-11-08 MED ORDER — LIDOCAINE-EPINEPHRINE (PF) 2 %-1:200000 IJ SOLN
20.0000 mL | Freq: Once | INTRAMUSCULAR | Status: AC
  Administered 2017-11-08: 20 mL via INTRADERMAL
  Filled 2017-11-08: qty 20

## 2017-11-08 MED ORDER — CEPHALEXIN 500 MG PO CAPS: 500.0000 mg | ORAL_CAPSULE | Freq: Three times a day (TID) | ORAL | 0 refills | Status: AC

## 2017-11-08 MED ORDER — TETANUS-DIPHTH-ACELL PERTUSSIS 5-2.5-18.5 LF-MCG/0.5 IM SUSP
0.5000 mL | Freq: Once | INTRAMUSCULAR | Status: AC
  Administered 2017-11-08: 0.5 mL via INTRAMUSCULAR
  Filled 2017-11-08: qty 0.5

## 2017-11-08 NOTE — ED Triage Notes (Signed)
Pt presents to ED via POV with c/o laceration to R knee. Pt states was walking and cut her knee on a nail in the couch.

## 2017-11-08 NOTE — ED Provider Notes (Signed)
[facility] EMERGENCY DEPARTMENT Provider Note   CSN: [id number] Arrival date & time: 11/08/17  0932     History   Chief Complaint Chief Complaint  Patient presents with  . Laceration    HPI Kara Murray is a 42 y.o. female presents to the emergency department for evaluation of laceration to her right distal thigh anteriorly.  Patient states just prior to arrival she was walking next to her couch when a fabric staple caused a laceration to her right anterior thigh.  Laceration approximately 8 cm.  She is ambulatory with no pain or discomfort.  She is able to fully extend the knee.  She denies any other pain or injury to her body.  No numbness or tingling throughout the lower extremity.  Her tetanus is not up-to-date.  HPI  Past Medical History:  Diagnosis Date  . Hypertension     Patient Active Problem List   Diagnosis Date Noted  . Hypertension 05/29/2015    Past Surgical History:  Procedure Laterality Date  . biariactric surgery  2010  . KNEE SURGERY  1994, 2001  . THUMB FUSION Left 04/11/14  . TOE SURGERY  2008    OB History    No data available       Home Medications    Prior to Admission medications   Medication Sig Start Date End Date Taking? Authorizing Provider  amLODipine (NORVASC) 5 MG tablet TAKE 1 TABLET BY MOUTH DAILY 01/05/17   Anola Gurney, PA  amLODipine (NORVASC) 5 MG tablet TAKE 1 TABLET BY MOUTH DAILY 03/04/17   Anola Gurney, PA  atenolol (TENORMIN) 50 MG tablet TAKE 1 TABLET BY MOUTH EVERY DAY 09/29/17   Anola Gurney, PA  cephALEXin (KEFLEX) 500 MG capsule Take 1 capsule (500 mg total) by mouth 3 (three) times daily for 7 days. 11/08/17 11/15/17  Evon Slack, PA-C  chlorthalidone (HYGROTON) 25 MG tablet Take 1 tablet (25 mg total) by mouth daily. 03/16/17   Anola Gurney, PA  HYDROcodone-homatropine [facility]) 5-1.5 MG/5ML syrup 5 ml 4-6 hours as needed for cough 05/29/16   Anola Gurney, PA    ondansetron (ZOFRAN ODT) 4 MG disintegrating tablet Take 1 tablet (4 mg total) by mouth every 6 (six) hours as needed for nausea or vomiting. 08/09/15   Sharyn Creamer, MD  traMADol (ULTRAM) 50 MG tablet Take 1 tablet (50 mg total) by mouth every 6 (six) hours as needed. 08/09/15   Sharyn Creamer, MD    Family History Family History  Problem Relation Age of Onset  . Hypertension Mother   . Diabetes Mother   . Heart attack Father   . Hypertension Brother     Social History Social History   Tobacco Use  . Smoking status: Never Smoker  . Smokeless tobacco: Never Used  Substance Use Topics  . Alcohol use: No    Alcohol/week: 0.0 oz    Frequency: Never  . Drug use: Not on file     Allergies   Patient has no known allergies.   Review of Systems Review of Systems  Respiratory: Negative for shortness of breath.   Cardiovascular: Negative for chest pain.  Gastrointestinal: Negative for abdominal pain.  Musculoskeletal: Negative for back pain and myalgias.  Skin: Positive for wound. Negative for rash.     Physical Exam Updated Vital Signs BP (!) 165/111 (BP Location: Right Arm)   Pulse 99   Temp 98.6 F (37 C) (Oral)   Resp 18   Ht 5'  2" (1.575 m)   Wt 81.6 kg (180 lb)   SpO2 95%   BMI 32.92 kg/m   Physical Exam  Constitutional: She is oriented to person, place, and time. She appears well-developed and well-nourished.  HENT:  Head: Normocephalic and atraumatic.  Eyes: Conjunctivae are normal.  Neck: Normal range of motion.  Cardiovascular: Normal rate.  Pulmonary/Chest: Effort normal. No respiratory distress.  Musculoskeletal:  Examination of the right lower extremity shows 8 cm linear laceration transverse along the distal anterior thigh.  She is able to actively straight leg raise.  Sensation is intact distally.  She is ambulatory with no antalgic component.  She has no pain with ambulation.  Laceration visualized in a clean bloodless field and no sign of visible or  palpable foreign body.  Neurological: She is alert and oriented to person, place, and time.  Skin: Skin is warm. No rash noted.  Psychiatric: She has a normal mood and affect. Her behavior is normal. Thought content normal.     ED Treatments / Results  Labs (all labs ordered are listed, but only abnormal results are displayed) Labs Reviewed - No data to display  EKG  EKG Interpretation None       Radiology No results found.  Procedures .Marland KitchenLaceration Repair Date/Time: 11/08/2017 11:05 AM Performed by: Evon Slack, PA-C Authorized by: Evon Slack, PA-C   Consent:    Consent obtained:  Verbal   Consent given by:  Patient   Risks discussed:  Infection, need for additional repair and nerve damage   Alternatives discussed:  No treatment Anesthesia (see MAR for exact dosages):    Anesthesia method:  Local infiltration   Local anesthetic:  Lidocaine 1% WITH epi Laceration details:    Location:  Leg   Leg location:  R knee   Length (cm):  8   Depth (mm):  20 Pre-procedure details:    Preparation:  Patient was prepped and draped in usual sterile fashion Exploration:    Hemostasis achieved with:  Epinephrine   Wound exploration: wound explored through full range of motion and entire depth of wound probed and visualized     Wound extent: no muscle damage noted and no tendon damage noted     Contaminated: no   Treatment:    Area cleansed with:  Betadine and saline   Amount of cleaning:  Standard   Irrigation volume:  60   Irrigation method:  Pressure wash Skin repair:    Repair method:  Sutures   Suture size:  4-0   Suture material:  Fast-absorbing gut   Suture technique:  Subcuticular   Number of sutures: 8.  4-0 Vicryl sutures placed subcuticular.  8 5-0 nylon sutures placed simple interrupted. Approximation:    Approximation:  Close   Vermilion border: well-aligned   Post-procedure details:    Dressing:  Non-adherent dressing   Patient tolerance of  procedure:  Tolerated well, no immediate complications   (including critical care time)  Medications Ordered in ED Medications  Tdap (BOOSTRIX) injection 0.5 mL (0.5 mLs Intramuscular Given 11/08/17 1023)  lidocaine-EPINEPHrine (XYLOCAINE W/EPI) 2 %-1:200000 (PF) injection 20 mL (20 mLs Intradermal Given 11/08/17 1023)     Initial Impression / Assessment and Plan / ED Course  I have reviewed the triage vital signs and the nursing notes.  Pertinent labs & imaging results that were available during my care of the patient were reviewed by me and considered in my medical decision making (see chart for details).  42 year old female with 8 cm laceration along the distal anterior thigh.  No tendon deficits noted.  No visible or palpable foreign body.  Subcuticular and superficial sutures applied.  Patient is educated on wound care.  She is placed on prophylactic antibiotics and she is also given tetanus shot.  She will follow-up in 10 days for suture removal and Steri-Strip application.  Final Clinical Impressions(s) / ED Diagnoses   Final diagnoses:  Knee laceration, right, initial encounter    ED Discharge Orders        Ordered    cephALEXin (KEFLEX) 500 MG capsule  3 times daily     11/08/17 1101       Ronnette JuniperGaines, Sadi Arave C, PA-C 11/08/17 1108    Governor RooksLord, Rebecca, MD 11/08/17 1520

## 2017-11-08 NOTE — Discharge Instructions (Signed)
You may shower but do not submerge laceration site under water.  Take antibiotics as prescribed.  Keep wound covered during the day.  Try not to kneel or bend the knee until after sutures have been removed.

## 2017-11-08 NOTE — ED Notes (Addendum)
FIRST NURSE NOTE:  Pt reports walking down hall and right knee got caught on nail, laceration to right knee. Pt has pressure applied at this time. Unsure when last tetanus shot is. Bleeding controlled at this time. Laceration present above right knee.

## 2017-11-08 NOTE — ED Notes (Signed)
Dry sterile dressing applied 

## 2017-11-08 NOTE — ED Notes (Signed)
See triage note  States she caught her right leg on the sofa   Laceration note below right knee

## 2017-11-12 ENCOUNTER — Other Ambulatory Visit: Payer: Self-pay | Admitting: Family Medicine

## 2017-11-12 ENCOUNTER — Telehealth: Payer: Self-pay | Admitting: Family Medicine

## 2017-11-12 MED ORDER — AMLODIPINE BESYLATE 5 MG PO TABS: 5.0000 mg | ORAL_TABLET | Freq: Every day | ORAL | 0 refills | Status: DC

## 2017-11-12 NOTE — Telephone Encounter (Signed)
[facility] pharmacy faxed a refill request for a 90-days supply for the following medication. Thanks CC ° °amLODipine (NORVASC) 5 MG tablet  ° °

## 2017-11-12 NOTE — Telephone Encounter (Signed)
Patient last office visit was 08/07/15 and was last filled on 03/04/17. I called patient to schedule office visit since she is overdue and she has an appt to be seen tomorrow. KW

## 2017-11-12 NOTE — Telephone Encounter (Signed)
done

## 2017-11-13 ENCOUNTER — Ambulatory Visit: Payer: BC Managed Care – PPO | Admitting: Family Medicine

## 2017-11-13 ENCOUNTER — Encounter: Payer: Self-pay | Admitting: Family Medicine

## 2017-11-13 ENCOUNTER — Other Ambulatory Visit: Payer: Self-pay | Admitting: Family Medicine

## 2017-11-13 VITALS — BP 142/96 | HR 74 | Temp 98.3°F | Resp 16 | Wt 179.0 lb

## 2017-11-13 DIAGNOSIS — I10 Essential (primary) hypertension: Secondary | ICD-10-CM | POA: Diagnosis not present

## 2017-11-13 DIAGNOSIS — M1991 Primary osteoarthritis, unspecified site: Secondary | ICD-10-CM | POA: Insufficient documentation

## 2017-11-13 DIAGNOSIS — M2240 Chondromalacia patellae, unspecified knee: Secondary | ICD-10-CM | POA: Insufficient documentation

## 2017-11-13 DIAGNOSIS — Z13220 Encounter for screening for lipoid disorders: Secondary | ICD-10-CM

## 2017-11-13 DIAGNOSIS — M1710 Unilateral primary osteoarthritis, unspecified knee: Secondary | ICD-10-CM | POA: Insufficient documentation

## 2017-11-13 DIAGNOSIS — M179 Osteoarthritis of knee, unspecified: Secondary | ICD-10-CM | POA: Insufficient documentation

## 2017-11-13 MED ORDER — ATENOLOL 50 MG PO TABS: 50.0000 mg | ORAL_TABLET | Freq: Every day | ORAL | 3 refills | Status: DC

## 2017-11-13 MED ORDER — AMLODIPINE BESYLATE 10 MG PO TABS: 10.0000 mg | ORAL_TABLET | Freq: Every day | ORAL | 3 refills | Status: DC

## 2017-11-13 NOTE — Progress Notes (Signed)
Subjective:     Patient ID: Kara Murray, female   DOB: 08-03-1976, 42 y.o.   MRN: [id number] Chief Complaint  Patient presents with  . Hypertension    Patient comes in office today for blood pressure follow up, patient was last seen in office on 08/07/15. Patients blood pressure at last visit was 130/102, patient reports good compliance and tolerance today on medication.   HPI States she has been compliant with atenolol and amlodipine over the last two months. [facility]. Also wishes to schedule suture removal following 11/08/17 ER visit  for next week.  Review of Systems  Respiratory: Negative for shortness of breath.   Cardiovascular: Negative for chest pain and palpitations.       Objective:   Physical Exam  Constitutional: She appears well-developed and well-nourished. No distress.  Cardiovascular: Normal rate and regular rhythm.  Pulmonary/Chest: Breath sounds normal.  Musculoskeletal: She exhibits no edema (of lower extremities).       Assessment:    1. Essential hypertension: increased amlodipine to 10 mg. daily - atenolol (TENORMIN) 50 MG tablet; Take 1 tablet (50 mg total) by mouth daily.  Dispense: 90 tablet; Refill: 3 - Comprehensive metabolic panel  2. Screening for cholesterol level - Lipid panel    Plan:    Nurse bp check in two weeks. Suture removal 3/13. Will call her about the lab results

## 2017-11-13 NOTE — Patient Instructions (Addendum)
Come in for an nurse bp check in two weeks. We will call you with the lab  Results.

## 2017-11-18 ENCOUNTER — Encounter: Payer: Self-pay | Admitting: Physician Assistant

## 2017-11-18 ENCOUNTER — Ambulatory Visit (INDEPENDENT_AMBULATORY_CARE_PROVIDER_SITE_OTHER): Payer: BC Managed Care – PPO | Admitting: Physician Assistant

## 2017-11-18 VITALS — BP 142/80 | HR 78 | Temp 98.6°F | Resp 16 | Wt 200.0 lb

## 2017-11-18 DIAGNOSIS — S81811D Laceration without foreign body, right lower leg, subsequent encounter: Secondary | ICD-10-CM | POA: Diagnosis not present

## 2017-11-18 NOTE — Progress Notes (Signed)
       Patient: Kathe BectonCatherine Williams Paradise Female    DOB: January 05, 1976   42 y.o.   MRN: [id number] Visit Date: 11/18/2017  Today's Provider: Margaretann LovelessJennifer M Yousaf Sainato, PA-C   Chief Complaint  Patient presents with  . Suture / Staple Removal   Subjective:    HPI Patient comes in today for a suture removal. She feels well today with no other complaints.     No Known Allergies   Current Outpatient Medications:  .  amLODipine (NORVASC) 10 MG tablet, Take 1 tablet (10 mg total) by mouth daily., Disp: 90 tablet, Rfl: 3 .  atenolol (TENORMIN) 50 MG tablet, Take 1 tablet (50 mg total) by mouth daily., Disp: 90 tablet, Rfl: 3  Review of Systems  Constitutional: Negative.   Respiratory: Negative.   Cardiovascular: Negative.   Musculoskeletal: Negative.   Skin: Positive for wound.  Neurological: Negative.     Social History   Tobacco Use  . Smoking status: Never Smoker  . Smokeless tobacco: Never Used  Substance Use Topics  . Alcohol use: No    Alcohol/week: 0.0 oz    Frequency: Never   Objective:   BP (!) 142/80 (BP Location: Right Arm, Patient Position: Sitting, Cuff Size: Large)   Pulse 78   Temp 98.6 F (37 C)   Resp 16   Wt 200 lb (90.7 kg)   BMI 36.58 kg/m  Vitals:   11/18/17 1535  BP: (!) 142/80  Pulse: 78  Resp: 16  Temp: 98.6 F (37 C)  Weight: 200 lb (90.7 kg)     Physical Exam  Skin:     Vitals reviewed.       Assessment & Plan:     1. Laceration of lower leg without complication, right, subsequent encounter Wound cleaned, sutures removed without complication and steri-strips applied. Leave steri-strips in place until they fall off. May shower normally.        Margaretann LovelessJennifer M Nyeema Want, PA-C  [facility]

## 2017-11-30 ENCOUNTER — Encounter: Payer: Self-pay | Admitting: Obstetrics and Gynecology

## 2017-11-30 ENCOUNTER — Telehealth: Payer: Self-pay | Admitting: Obstetrics and Gynecology

## 2017-11-30 ENCOUNTER — Ambulatory Visit (INDEPENDENT_AMBULATORY_CARE_PROVIDER_SITE_OTHER): Payer: BC Managed Care – PPO | Admitting: Obstetrics and Gynecology

## 2017-11-30 VITALS — BP 130/80 | HR 88 | Ht 62.0 in | Wt 206.0 lb

## 2017-11-30 DIAGNOSIS — Z1239 Encounter for other screening for malignant neoplasm of breast: Secondary | ICD-10-CM

## 2017-11-30 DIAGNOSIS — Z01419 Encounter for gynecological examination (general) (routine) without abnormal findings: Secondary | ICD-10-CM | POA: Diagnosis not present

## 2017-11-30 DIAGNOSIS — Z1231 Encounter for screening mammogram for malignant neoplasm of breast: Secondary | ICD-10-CM | POA: Diagnosis not present

## 2017-11-30 DIAGNOSIS — Z124 Encounter for screening for malignant neoplasm of cervix: Secondary | ICD-10-CM

## 2017-11-30 DIAGNOSIS — Z1151 Encounter for screening for human papillomavirus (HPV): Secondary | ICD-10-CM

## 2017-11-30 DIAGNOSIS — Z30431 Encounter for routine checking of intrauterine contraceptive device: Secondary | ICD-10-CM | POA: Diagnosis not present

## 2017-11-30 NOTE — Patient Instructions (Addendum)
I value your feedback and entrusting us with your care. If you get a [place] patient survey, I would appreciate you taking the time to let us know about your experience today. Thank you!  [facility]: [phone]  [facility]: [phone]  

## 2017-11-30 NOTE — Telephone Encounter (Signed)
Noted. Will order to arrive by apt date/time. 

## 2017-11-30 NOTE — Progress Notes (Signed)
PCP:  [name], MD   Chief Complaint  Patient presents with  . Gynecologic Exam     HPI:      [name] is a 42 y.o. [id number] who LMP was No LMP recorded. (Menstrual status: IUD)., presents today for her NP (over 3 yrs) annual examination.  Her menses are regular every 28-30 days, lasting 1 days, light flow with IUD.  Dysmenorrhea none. She does not have intermenstrual bleeding.  Sex activity: single partner, contraception - IUD. Mirena placed 12/23/12. Wants another one. Last Pap: June 15, 2014  Results were: no abnormalities /neg HPV DNA  Hx of STDs: chlamydia in college  Last mammogram: December 23, 2012  Results were: normal--routine follow-up in 12 months There is a FH of breast cancer in her mat aunt and cousin, genetic testing not indicated. There is no FH of ovarian cancer. The patient does do self-breast exams.  Tobacco use: The patient denies current or previous tobacco use. Alcohol use: social drinker No drug use.  Exercise: moderately active  She does get adequate calcium and Vitamin D in her diet.  Labs with PCP.   Past Medical History:  Diagnosis Date  . Hypertension     Past Surgical History:  Procedure Laterality Date  . biariactric surgery  2010  . KNEE SURGERY  1994, 2001  . THUMB FUSION Left 04/11/14  . TOE SURGERY  2008    Family History  Problem Relation Age of Onset  . Hypertension Mother   . Diabetes Mother   . Heart attack Father   . Hypertension Brother   . Breast cancer Maternal Aunt 50  . Breast cancer Cousin     Social History   Socioeconomic History  . Marital status: Married    Spouse name: Not on file  . Number of children: Not on file  . Years of education: Not on file  . Highest education level: Not on file  Occupational History  . Not on file  Social Needs  . Financial resource strain: Not on file  . Food insecurity:    Worry: Not on file    Inability: Not on file  . Transportation needs:    Medical: Not on file    Non-medical: Not on file  Tobacco Use  . Smoking status: Never Smoker  . Smokeless tobacco: Never Used  Substance and Sexual Activity  . Alcohol use: No    Alcohol/week: 0.0 oz    Frequency: Never  . Drug use: Never  . Sexual activity: Yes    Birth control/protection: IUD    Comment: [place]   Lifestyle  . Physical activity:    Days per week: Not on file    Minutes per session: Not on file  . Stress: Not on file  Relationships  . Social connections:    Talks on phone: Not on file    Gets together: Not on file    Attends religious service: Not on file    Active member of club or organization: Not on file    Attends meetings of clubs or organizations: Not on file    Relationship status: Not on file  . Intimate partner violence:    Fear of current or ex partner: Not on file    Emotionally abused: Not on file    Physically abused: Not on file    Forced sexual activity: Not on file  Other Topics Concern  . Not on file  Social History Narrative  . Not  on file    Outpatient Medications Prior to Visit  Medication Sig Dispense Refill  . amLODipine (NORVASC) 10 MG tablet Take 1 tablet (10 mg total) by mouth daily. 90 tablet 3  . atenolol (TENORMIN) 50 MG tablet Take 1 tablet (50 mg total) by mouth daily. 90 tablet 3  . levonorgestrel (MIRENA) 20 MCG/24HR IUD 1 each by Intrauterine route once.     No facility-administered medications prior to visit.       ROS:  Review of Systems  Constitutional: Negative for fatigue, fever and unexpected weight change.  Respiratory: Negative for cough, shortness of breath and wheezing.   Cardiovascular: Negative for chest pain, palpitations and leg swelling.  Gastrointestinal: Negative for blood in stool, constipation, diarrhea, nausea and vomiting.  Endocrine: Negative for cold intolerance, heat intolerance and polyuria.  Genitourinary: Negative for dyspareunia, dysuria, flank pain, frequency, genital sores,  hematuria, menstrual problem, pelvic pain, urgency, vaginal bleeding, vaginal discharge and vaginal pain.  Musculoskeletal: Negative for back pain, joint swelling and myalgias.  Skin: Negative for rash.  Neurological: Negative for dizziness, syncope, light-headedness, numbness and headaches.  Hematological: Negative for adenopathy.  Psychiatric/Behavioral: Negative for agitation, confusion, sleep disturbance and suicidal ideas. The patient is not nervous/anxious.    BREAST: No symptoms   Objective: BP 130/80   Pulse 88   Ht 5\' 2"  (1.575 m)   Wt 206 lb (93.4 kg)   BMI 37.68 kg/m    Physical Exam  Constitutional: She is oriented to person, place, and time. She appears well-developed and well-nourished.  Genitourinary: Vagina normal and uterus normal. There is no rash or tenderness on the right labia. There is no rash or tenderness on the left labia. No erythema or tenderness in the vagina. No vaginal discharge found. Right adnexum does not display mass and does not display tenderness. Left adnexum does not display mass and does not display tenderness.  Cervix exhibits visible IUD strings. Cervix does not exhibit motion tenderness or polyp. Uterus is not enlarged or tender.  Neck: Normal range of motion. No thyromegaly present.  Cardiovascular: Normal rate, regular rhythm and normal heart sounds.  No murmur heard. Pulmonary/Chest: Effort normal and breath sounds normal. Right breast exhibits no mass, no nipple discharge, no skin change and no tenderness. Left breast exhibits no mass, no nipple discharge, no skin change and no tenderness.  Abdominal: Soft. There is no tenderness. There is no guarding.  Musculoskeletal: Normal range of motion.  Neurological: She is alert and oriented to person, place, and time. No cranial nerve deficit.  Psychiatric: She has a normal mood and affect. Her behavior is normal.  Vitals reviewed.   Assessment/Plan: Encounter for annual routine gynecological  examination  Cervical cancer screening - Plan: IGP, Aptima HPV  Screening for HPV (human papillomavirus) - Plan: IGP, Aptima HPV  Screening for breast cancer - Pt to sched mammo. - Plan: MM DIGITAL SCREENING BILATERAL  Encounter for routine checking of intrauterine contraceptive device (IUD) - IUD due for removal 4/19. Pt wants another one. RTO for replacement.        GYN counsel breast self exam, mammography screening, family planning choices, adequate intake of calcium and vitamin D, diet and exercise     F/U RTO for IUD replacement by 4/19/  Return in about 1 year (around 12/01/2018).  Alicia B. Copland, PA-C 11/30/2017 11:43 AM

## 2017-11-30 NOTE — Telephone Encounter (Signed)
4/25 at 930 with ABC for mirena

## 2017-12-02 LAB — IGP, APTIMA HPV
HPV Aptima: NEGATIVE
PAP Smear Comment: 0

## 2017-12-29 NOTE — Telephone Encounter (Signed)
Patient reschedule to 01/06/18 at 9:30 for mirena

## 2017-12-31 ENCOUNTER — Ambulatory Visit: Payer: BC Managed Care – PPO | Admitting: Obstetrics and Gynecology

## 2018-01-06 ENCOUNTER — Ambulatory Visit: Payer: BC Managed Care – PPO | Admitting: Obstetrics and Gynecology

## 2018-01-06 ENCOUNTER — Encounter: Payer: Self-pay | Admitting: Obstetrics and Gynecology

## 2018-01-06 VITALS — BP 130/88 | HR 84 | Ht 61.0 in | Wt 205.0 lb

## 2018-01-06 DIAGNOSIS — Z30433 Encounter for removal and reinsertion of intrauterine contraceptive device: Secondary | ICD-10-CM

## 2018-01-06 MED ORDER — LEVONORGESTREL 20 MCG/24HR IU IUD: 1.0000 | INTRAUTERINE_SYSTEM | Freq: Once | INTRAUTERINE | 0 refills | Status: AC

## 2018-01-06 NOTE — Telephone Encounter (Signed)
Mirena reserved for this patient. 

## 2018-01-06 NOTE — Progress Notes (Signed)
   Chief Complaint  Patient presents with  . Contraception    IUD removal/Reinsert      History of Present Illness:  Kara Murray is a 42 y.o. that had a Mirena IUD placed approximately 5 years ago. Since that time, she denies dyspareunia, pelvic pain, non-menstrual bleeding, vaginal d/c, heavy bleeding. It has expired and she would like another one today.   BP 130/88   Pulse 84   Ht  (1.549 m)   Wt 205 lb (93 kg)   BMI 38.73 kg/m   Pelvic exam:  Two IUD strings present seen coming from the cervical os. EGBUS, vaginal vault and cervix: within normal limits  IUD Removal Strings of IUD identified and grasped.  IUD removed without problem with ring forceps.  Pt tolerated this well.  IUD noted to be intact.  IUD Insertion Procedure Note Patient identified, informed consent performed, consent signed.   Discussed risks of irregular bleeding, cramping, infection, malpositioning or misplacement of the IUD outside the uterus which may require further procedure such as laparoscopy, risk of failure <1%. Time out was performed.    Speculum placed in the vagina.  Cervix visualized.  Cleaned with Betadine x 2.  Grasped anteriorly with a single tooth tenaculum.  Uterus sounded to 7.0 cm.   IUD placed per manufacturer's recommendations.  Strings trimmed to 3 cm. Tenaculum was removed, good hemostasis noted.  Patient tolerated procedure well.   ASSESSMENT:  Encounter for removal and reinsertion of IUD - Plan: levonorgestrel (MIRENA) 20 MCG/24HR IUD   Meds ordered this encounter  Medications  . levonorgestrel (MIRENA) 20 MCG/24HR IUD    Sig: 1 Intra Uterine Device (1 each total) by Intrauterine route once for 1 dose.    Dispense:  1 each    Refill:  0    Order Specific Question:   Supervising Provider    Answer:   Nadara Mustard [[id number]]     Plan:  Patient was given post-procedure instructions.  She was advised to have backup contraception for one week.   Call if you  are having increasing pain, cramps or bleeding or if you have a fever greater than 100.4 degrees F., shaking chills, nausea or vomiting. Patient was also asked to check IUD strings periodically and follow up in 4 weeks for IUD check.  Return in about 1 month (around 02/03/2018) for IUD f/u.  Izzie Geers B. Beva Remund, PA-C 01/06/2018 9:54 AM

## 2018-01-06 NOTE — Patient Instructions (Addendum)
I value your feedback and entrusting us with your care. If you get a [place] patient survey, I would appreciate you taking the time to let us know about your experience today. Thank you!  [facility] [phone]  Instructions after IUD insertion  Most women experience no significant problems after insertion of an IUD, however minor cramping and spotting for a few days is common. Cramps may be treated with ibuprofen 800mg every 8 hours or Tylenol 650 mg every 4 hours. Contact [facility] immediately if you experience any of the following symptoms during the next week: temperature >99.6 degrees, worsening pelvic pain, abdominal pain, fainting, unusually heavy vaginal bleeding, foul vaginal discharge, or if you think you have expelled the IUD.  Nothing inserted in the vagina for 48 hours. You will be scheduled for a follow up visit in approximately four weeks.  You should check monthly to be sure you can feel the IUD strings in the upper vagina. If you are having a monthly period, try to check after each period. If you cannot feel the IUD strings,  contact [facility] immediately so we can do an exam to determine if the IUD has been expelled.   Please use backup protection until we can confirm the IUD is in place.  Call [facility] if you are exposed to or diagnosed with a sexually transmitted infection, as we will need to discuss whether it is safe for you to continue using an IUD.   

## 2018-01-20 ENCOUNTER — Ambulatory Visit (INDEPENDENT_AMBULATORY_CARE_PROVIDER_SITE_OTHER): Payer: BC Managed Care – PPO | Admitting: Family Medicine

## 2018-01-20 ENCOUNTER — Encounter: Payer: Self-pay | Admitting: Family Medicine

## 2018-01-20 VITALS — BP 130/90 | HR 90 | Temp 98.5°F | Resp 16 | Wt 204.4 lb

## 2018-01-20 DIAGNOSIS — B349 Viral infection, unspecified: Secondary | ICD-10-CM

## 2018-01-20 LAB — POC INFLUENZA A&B (BINAX/QUICKVUE)
Influenza A, POC: NEGATIVE
Influenza B, POC: NEGATIVE

## 2018-01-20 MED ORDER — HYDROCODONE-HOMATROPINE 5-1.5 MG/5ML PO SYRP: 5.0000 mL | ORAL_SOLUTION | Freq: Four times a day (QID) | ORAL | 0 refills | Status: AC | PRN

## 2018-01-20 NOTE — Patient Instructions (Signed)
Discussed use of Mucinex D for congestion. 

## 2018-01-20 NOTE — Progress Notes (Signed)
  Subjective:     Patient ID: Kara Murray, female   DOB: June 08, 1976, 42 y.o.   MRN: [id number] Chief Complaint  Patient presents with  . Cough    Patient comes in office today with complaints of cough and congestion for the past two days. Patient reports cough is productive of bloody sputum, headache, ear ache, sore throat, and runny nose. Patient has been taking otc Sinus and Congestion for relief.    HPI Denies fever at home. States she has been having loose stools and is vomiting up drainage in the office today in addition to the sx above. No flu shot this season.  Review of Systems     Objective:   Physical Exam  Constitutional: She appears well-developed and well-nourished. She has a sickly appearance. No distress.  Ears: T.M's intact without inflammation Throat: tonsils absent without posterior pharyngeal erythema Neck: no cervical adenopathy Lungs: clear     Assessment:    1. Acute viral syndrome - POC Influenza A&B(BINAX/QUICKVUE)    Plan:    Discussed use of Mucinex D. Work excuse for 5/15-5/17/19.

## 2018-01-21 NOTE — Telephone Encounter (Signed)
Mirena received & charged on 01/06/18

## 2018-01-26 DIAGNOSIS — M25561 Pain in right knee: Secondary | ICD-10-CM | POA: Insufficient documentation

## 2018-02-08 ENCOUNTER — Ambulatory Visit: Payer: BC Managed Care – PPO | Admitting: Obstetrics and Gynecology

## 2018-11-23 DIAGNOSIS — M7671 Peroneal tendinitis, right leg: Secondary | ICD-10-CM | POA: Diagnosis not present

## 2018-11-23 DIAGNOSIS — M79671 Pain in right foot: Secondary | ICD-10-CM | POA: Diagnosis not present

## 2018-11-25 ENCOUNTER — Other Ambulatory Visit: Payer: Self-pay | Admitting: Family Medicine

## 2018-11-25 DIAGNOSIS — I10 Essential (primary) hypertension: Secondary | ICD-10-CM

## 2018-11-25 MED ORDER — ATENOLOL 50 MG PO TABS: 50.0000 mg | ORAL_TABLET | Freq: Every day | ORAL | 3 refills | Status: DC

## 2018-11-25 MED ORDER — AMLODIPINE BESYLATE 10 MG PO TABS: 10.0000 mg | ORAL_TABLET | Freq: Every day | ORAL | 3 refills | Status: DC

## 2018-11-25 NOTE — Telephone Encounter (Signed)
Pt needs a refill on   Amlodipine[ine 10 mg  Atenolol 50 mg  She is now using [facility] [address] because  her insurance has changed  CB# [phone]  Barth Kirks

## 2018-12-14 DIAGNOSIS — M7671 Peroneal tendinitis, right leg: Secondary | ICD-10-CM | POA: Diagnosis not present

## 2019-09-19 NOTE — Patient Instructions (Addendum)
I value your feedback and entrusting us with your care. If you get a [facility] patient survey, I would appreciate you taking the time to let us know about your experience today. Thank you!  As of August 18, 2019, your lab results will be released to your MyChart immediately, before I even have a chance to see them. Please give me time to review them and contact you if there are any abnormalities. Thank you for your patience.   [facility]: [phone]  [facility]: [phone]  

## 2019-09-19 NOTE — Progress Notes (Signed)
PCP:  [name], MD   Chief Complaint  Patient presents with  . Gynecologic Exam     HPI:      [name] is a 44 y.o. [id number] who LMP was No LMP recorded. (Menstrual status: IUD)., presents today for her annual examination.  Her menses are infrequent with IUD, lasting 1 days, light flow with wiping only.  Dysmenorrhea none. She does have vasomotor sx.  Sex activity: single partner, contraception - IUD. Mirena REplaced 01/06/18.  Last Pap: 11/30/17  Results were: no abnormalities /neg HPV DNA  Hx of STDs: chlamydia in college  Last mammogram: December 23, 2012  Results were: normal--routine follow-up in 12 months.  There is a FH of breast cancer in her mat aunt and cousin, genetic testing not indicated. There is no FH of ovarian cancer. The patient does do self-breast exams.  Tobacco use: The patient denies current or previous tobacco use. Alcohol use: social drinker No drug use.  Exercise: min active  She does get adequate calcium but not Vitamin D in her diet.  Labs with PCP.   Past Medical History:  Diagnosis Date  . Hypertension     Past Surgical History:  Procedure Laterality Date  . biariactric surgery  2010  . KNEE SURGERY  1994, 2001  . THUMB FUSION Left 04/11/14  . TOE SURGERY  2008    Family History  Problem Relation Age of Onset  . Hypertension Mother   . Diabetes Mother   . Heart attack Father   . Hypertension Brother   . Breast cancer Maternal Aunt 50  . Breast cancer Cousin     Social History   Socioeconomic History  . Marital status: Married    Spouse name: Not on file  . Number of children: Not on file  . Years of education: Not on file  . Highest education level: Not on file  Occupational History  . Not on file  Tobacco Use  . Smoking status: Never Smoker  . Smokeless tobacco: Never Used  Substance and Sexual Activity  . Alcohol use: No    Alcohol/week: 0.0 standard drinks  . Drug use: Never  . Sexual activity:  Yes    Birth control/protection: I.U.D.    Comment: Mirena   Other Topics Concern  . Not on file  Social History Narrative  . Not on file   Social Determinants of Health   Financial Resource Strain:   . Difficulty of Paying Living Expenses: Not on file  Food Insecurity:   . Worried About Programme researcher, broadcasting/film/video in the Last Year: Not on file  . Ran Out of Food in the Last Year: Not on file  Transportation Needs:   . Lack of Transportation (Medical): Not on file  . Lack of Transportation (Non-Medical): Not on file  Physical Activity:   . Days of Exercise per Week: Not on file  . Minutes of Exercise per Session: Not on file  Stress:   . Feeling of Stress : Not on file  Social Connections:   . Frequency of Communication with Friends and Family: Not on file  . Frequency of Social Gatherings with Friends and Family: Not on file  . Attends Religious Services: Not on file  . Active Member of Clubs or Organizations: Not on file  . Attends Banker Meetings: Not on file  . Marital Status: Not on file  Intimate Partner Violence:   . Fear of Current or Ex-Partner: Not on  file  . Emotionally Abused: Not on file  . Physically Abused: Not on file  . Sexually Abused: Not on file    Outpatient Medications Prior to Visit  Medication Sig Dispense Refill  . amLODipine (NORVASC) 10 MG tablet Take 1 tablet (10 mg total) by mouth daily. 90 tablet 3  . atenolol (TENORMIN) 50 MG tablet Take 1 tablet (50 mg total) by mouth daily. 90 tablet 3  . levonorgestrel (MIRENA) 20 MCG/24HR IUD 1 Intra Uterine Device (1 each total) by Intrauterine route once for 1 dose. 1 each 0   No facility-administered medications prior to visit.      ROS:  Review of Systems  Constitutional: Negative for fatigue, fever and unexpected weight change.  Respiratory: Negative for cough, shortness of breath and wheezing.   Cardiovascular: Negative for chest pain, palpitations and leg swelling.    Gastrointestinal: Negative for blood in stool, constipation, diarrhea, nausea and vomiting.  Endocrine: Negative for cold intolerance, heat intolerance and polyuria.  Genitourinary: Negative for dyspareunia, dysuria, flank pain, frequency, genital sores, hematuria, menstrual problem, pelvic pain, urgency, vaginal bleeding, vaginal discharge and vaginal pain.  Musculoskeletal: Negative for back pain, joint swelling and myalgias.  Skin: Negative for rash.  Neurological: Negative for dizziness, syncope, light-headedness, numbness and headaches.  Hematological: Negative for adenopathy.  Psychiatric/Behavioral: Negative for agitation, confusion, sleep disturbance and suicidal ideas. The patient is not nervous/anxious.    BREAST: No symptoms   Objective: BP 116/80   Ht 5\' 2"  (1.575 m)   Wt 207 lb (93.9 kg)   BMI 37.86 kg/m    Physical Exam Constitutional:      Appearance: She is well-developed.  Genitourinary:     Vulva, vagina, uterus, right adnexa and left adnexa normal.     No vulval lesion or tenderness noted.     No vaginal discharge, erythema or tenderness.     No cervical motion tenderness or polyp.     IUD strings visualized.     Uterus is not enlarged or tender.     No right or left adnexal mass present.     Right adnexa not tender.     Left adnexa not tender.  Neck:     Thyroid: No thyromegaly.  Cardiovascular:     Rate and Rhythm: Normal rate and regular rhythm.     Heart sounds: Normal heart sounds. No murmur.  Pulmonary:     Effort: Pulmonary effort is normal.     Breath sounds: Normal breath sounds.  Chest:     Breasts:        Right: No mass, nipple discharge, skin change or tenderness.        Left: No mass, nipple discharge, skin change or tenderness.  Abdominal:     Palpations: Abdomen is soft.     Tenderness: There is no abdominal tenderness. There is no guarding.  Musculoskeletal:        General: Normal range of motion.     Cervical back: Normal range  of motion.  Neurological:     General: No focal deficit present.     Mental Status: She is alert and oriented to person, place, and time.     Cranial Nerves: No cranial nerve deficit.  Skin:    General: Skin is warm and dry.  Psychiatric:        Mood and Affect: Mood normal.        Behavior: Behavior normal.        Thought Content: Thought content normal.  Judgment: Judgment normal.  Vitals reviewed.     Assessment/Plan: Encounter for annual routine gynecological examination  Encounter for routine checking of intrauterine contraceptive device (IUD)  Encounter for screening mammogram for malignant neoplasm of breast - Plan: 3D MAMMOGRAM SCREENING BILATERAL; pt to sched mammo  GYN counsel breast self exam, mammography screening, family planning choices, adequate intake of calcium and vitamin D, diet and exercise     F/U   Return in about 1 year (around 09/19/2020).  Emmaleigh Longo B. Layanna Charo, PA-C 09/20/2019 8:46 AM

## 2019-09-20 ENCOUNTER — Encounter: Payer: Self-pay | Admitting: Obstetrics and Gynecology

## 2019-09-20 ENCOUNTER — Ambulatory Visit (INDEPENDENT_AMBULATORY_CARE_PROVIDER_SITE_OTHER): Payer: BC Managed Care – PPO | Admitting: Obstetrics and Gynecology

## 2019-09-20 ENCOUNTER — Other Ambulatory Visit: Payer: Self-pay

## 2019-09-20 VITALS — BP 116/80 | Ht 62.0 in | Wt 207.0 lb

## 2019-09-20 DIAGNOSIS — Z01419 Encounter for gynecological examination (general) (routine) without abnormal findings: Secondary | ICD-10-CM

## 2019-09-20 DIAGNOSIS — Z30431 Encounter for routine checking of intrauterine contraceptive device: Secondary | ICD-10-CM

## 2019-09-20 DIAGNOSIS — Z1231 Encounter for screening mammogram for malignant neoplasm of breast: Secondary | ICD-10-CM

## 2019-11-14 NOTE — Progress Notes (Signed)
       Patient: Kara Murray Female    DOB: 18-Oct-1975   44 y.o.   MRN: [id number] Visit Date: 11/15/2019  Today's Provider: Mila Merry, MD   Chief Complaint  Patient presents with  . Ear Pain  . Sore Throat   Subjective:    Virtual Visit via Telephone Note  I connected with Kara Murray on 11/15/19 at 11:00 AM EST by telephone and verified that I am speaking with the correct person using two identifiers.  Location: Patient: home Provider: [facility]   I discussed the limitations, risks, security and privacy concerns of performing an evaluation and management service by telephone and the availability of in person appointments. I also discussed with the patient that there may be a patient responsible charge related to this service. The patient expressed understanding and agreed to proceed.   URI  This is a new problem. The current episode started in the past 7 days. The problem has been gradually worsening. There has been no fever. Associated symptoms include ear pain and a sore throat. She has tried acetaminophen (throat spray and Mucinex) for the symptoms. The treatment provided mild relief.    No Known Allergies   Current Outpatient Medications:  .  amLODipine (NORVASC) 10 MG tablet, Take 1 tablet (10 mg total) by mouth daily., Disp: 90 tablet, Rfl: 3 .  atenolol (TENORMIN) 50 MG tablet, Take 1 tablet (50 mg total) by mouth daily., Disp: 90 tablet, Rfl: 3 .  levonorgestrel (MIRENA) 20 MCG/24HR IUD, 1 Intra Uterine Device (1 each total) by Intrauterine route once for 1 dose., Disp: 1 each, Rfl: 0  Review of Systems  Constitutional: Negative.   HENT: Positive for ear pain and sore throat.   Respiratory: Negative.   Cardiovascular: Negative.   Musculoskeletal: Negative.     Social History   Tobacco Use  . Smoking status: Never Smoker  . Smokeless tobacco: Never Used  Substance Use Topics  . Alcohol use: No    Alcohol/week: 0.0 standard drinks     Objective:   There were no vitals taken for this visit.    Physical Exam  Awake, alert, oriented x 3. In no apparent distress      Assessment & Plan     1. Acute otitis media, unspecified otitis media type  - amoxicillin (AMOXIL) 500 MG capsule; Take 2 capsules (1,000 mg total) by mouth 2 (two) times daily for 10 days.  Dispense: 40 capsule; Refill: 0   Call if symptoms change or if not rapidly improving.      I discussed the assessment and treatment plan with the patient. The patient was provided an opportunity to ask questions and all were answered. The patient agreed with the plan and demonstrated an understanding of the instructions.   The patient was advised to call back or seek an in-person evaluation if the symptoms worsen or if the condition fails to improve as anticipated.  I provided 8 minutes of non-face-to-face time during this encounter.  The entirety of the information documented in the History of Present Illness, Review of Systems and Physical Exam were personally obtained by me. Portions of this information were initially documented by Martyn Ehrich, CMA and reviewed by me for thoroughness and accuracy.   Mila Merry, MD  [facility]

## 2019-11-15 ENCOUNTER — Ambulatory Visit (INDEPENDENT_AMBULATORY_CARE_PROVIDER_SITE_OTHER): Payer: BC Managed Care – PPO | Admitting: Family Medicine

## 2019-11-15 ENCOUNTER — Encounter: Payer: Self-pay | Admitting: Family Medicine

## 2019-11-15 DIAGNOSIS — H6690 Otitis media, unspecified, unspecified ear: Secondary | ICD-10-CM | POA: Diagnosis not present

## 2019-11-15 MED ORDER — AMOXICILLIN 500 MG PO CAPS: 1000.0000 mg | ORAL_CAPSULE | Freq: Two times a day (BID) | ORAL | 0 refills | Status: AC

## 2019-11-16 ENCOUNTER — Telehealth: Payer: Self-pay

## 2019-11-16 NOTE — Telephone Encounter (Signed)
She can have a work excuse through the end of this week.

## 2019-11-16 NOTE — Telephone Encounter (Signed)
Copied from CRM [id number]. Topic: General - Other >> Nov 16, 2019  8:32 AM Kara Murray A wrote: Patient would like a callback from nurse in regards to Dr. Sherrie Mustache providing a work note allowing her to return to work. Her employer is requiring this due to patient still having a sore throat. Please advise

## 2019-11-16 NOTE — Telephone Encounter (Signed)
Please review and advise.

## 2019-11-16 NOTE — Telephone Encounter (Signed)
Patient called back to say that along with the note that she need she has a few questions that she need to ask the nurse also. Patient say that her throat is still sore and that she can be reached at Ph# [phone]

## 2019-11-17 NOTE — Telephone Encounter (Signed)
Pt calling back.  States that she still needs the work note.  Also states that she has started to get blood up with the mucus. Pt can be reached at [phone] or [phone] (this number is for Kandee Keen, spouse - pt states she is very tired and is going to try and rest but doesn't want to miss the phone call).

## 2019-11-17 NOTE — Telephone Encounter (Signed)
Kara Murray returned [name]'s call/ please advise

## 2019-11-17 NOTE — Telephone Encounter (Signed)
Tried calling patient. Left message to call back. 

## 2019-11-17 NOTE — Telephone Encounter (Signed)
Patient advised that her work note ready for pick up. She will have her son come by the office tomorrow to pick up note.    Patient wanted to let Dr. Sherrie Mustache know that when she blows her nose she sometimes has blood mixed in the mucus. There has been some clots of blood when she blows her nose. Patient states she is extremely tired and all she wants to do is sleep. Patient denies any fever. She still has some ear pain with slight improvement. She also has a sore throat. Patient has started taking the antibiotic that was prescribed and is tolerating it well. Patient states she had COVID back in November 2020, and she feels worse now than when she tested positive for COVID several months ago.

## 2019-11-18 ENCOUNTER — Telehealth: Payer: Self-pay | Admitting: Family Medicine

## 2019-11-18 NOTE — Telephone Encounter (Signed)
Patient is asking for Dr. Sherrie Mustache to call her back regarding her ear.    She left a message on 11/17/19 asking him to contact her but she has heard from him.

## 2019-11-21 NOTE — Telephone Encounter (Signed)
Please see other telephone message  

## 2019-11-21 NOTE — Telephone Encounter (Signed)
The antibiotic take 5-7 days to help. Should be starting to feel better about now. If not then she may be resistant and we can change to a different antibiotic.

## 2019-11-21 NOTE — Telephone Encounter (Addendum)
Patient states she does feel better. The ear and throat pain is gradually improving. She is still taking the antibiotics prescribed.

## 2019-12-05 ENCOUNTER — Telehealth: Payer: Self-pay

## 2019-12-05 NOTE — Telephone Encounter (Signed)
Copied from CRM [id number]. Topic: General - Inquiry >> Dec 05, 2019  4:14 PM Kara Murray Payment wrote: Reason for CRM: Patient is requesting her AVS from last visit she had mailed to her. Address [address] [facility] [address]  [place] [address] Call back [phone]

## 2020-01-05 NOTE — Progress Notes (Signed)
Established patient visit   Patient: Kara Murray   DOB: Apr 04, 1976   44 y.o. Female  MRN: [id number] Visit Date: 01/06/2020   Today's healthcare provider: Mila Merry, MD   Chief Complaint  Patient presents with  . Leg Swelling  . Ear Pain   Velora Mediate as a scribe for Mila Merry, MD.,have documented all relevant documentation on the behalf of Mila Merry, MD,as directed by  Mila Merry, MD while in the presence of Mila Merry, MD.  Subjective    Leg Swelling:  This is a new problem. The current episode started more than 1 month ago. The problem occurs intermittently. The problem has been gradually worsening. Exacerbated by: sitting at work all day  She has tried acetaminophen and NSAIDs for the symptoms. The treatment provided no relief. She has been consuming more salt in her diet and taking OTC Motrin a few times every day for pain behind her right ear.     Medications: Outpatient Medications Prior to Visit  Medication Sig  . amLODipine (NORVASC) 10 MG tablet Take 1 tablet (10 mg total) by mouth daily.  Marland Kitchen atenolol (TENORMIN) 50 MG tablet Take 1 tablet (50 mg total) by mouth daily.  Marland Kitchen levonorgestrel (MIRENA) 20 MCG/24HR IUD 1 Intra Uterine Device (1 each total) by Intrauterine route once for 1 dose.   No facility-administered medications prior to visit.    Has been having ear pain for a few months treated with antibiotic. Has had evaluation by Dr. Willeen Cass with normal. Exam. Takes motrin a few times a day.   States had J&J   Review of Systems  Constitutional: Negative.   HENT: Positive for ear pain.   Respiratory: Negative.   Cardiovascular: Positive for leg swelling.  Musculoskeletal: Negative.   Neurological: Positive for headaches.      Objective    BP 132/83 (BP Location: Right Arm, Patient Position: Sitting, Cuff Size: Large)   Pulse 94   Temp (!) 97.1 F (36.2 C) (Temporal)   Wt 219 lb 9.6 oz (99.6 kg)   BMI 40.17  kg/m    Physical Exam   General: Appearance:    Severely obese female in no acute distress  Eyes:    PERRL, conjunctiva/corneas clear, EOM's intact       Lungs:     Clear to auscultation bilaterally, respirations unlabored  Heart:    Normal heart rate. Normal rhythm. No murmurs, rubs, or gallops.   MS:   All extremities are intact. 2+ bipedal edema  Neurologic:   Awake, alert, oriented x 3. No apparent focal neurological           defect.       No results found for any visits on 01/06/20.  Assessment & Plan    1. Edema, unspecified type Likely multifactorial including increase sodium intake, increase NSAID intake, and calcium change blocker. Consider reducing amlodipine and/or adding hctz to regiment if labs are normal.  - TSH - T4, free - Comprehensive metabolic panel - CBC  2. Essential hypertension Well controlled.   - Lipid panel  3. Right ear pain Has been evaluated by Dr. Willeen Cass. I suspect she has some TMJ but recommend reducing OTC NSAID due to edema.    No follow-ups on file.      The entirety of the information documented in the History of Present Illness, Review of Systems and Physical Exam were personally obtained by me. Portions of this information were initially documented by the CMA and  reviewed by me for thoroughness and accuracy.      Lelon Huh, MD  [facility] [phone] (phone) [phone] (fax)  [place]

## 2020-01-06 ENCOUNTER — Encounter: Payer: Self-pay | Admitting: Family Medicine

## 2020-01-06 ENCOUNTER — Other Ambulatory Visit: Payer: Self-pay

## 2020-01-06 ENCOUNTER — Ambulatory Visit: Payer: BC Managed Care – PPO | Admitting: Family Medicine

## 2020-01-06 VITALS — BP 132/83 | HR 94 | Temp 97.1°F | Wt 219.6 lb

## 2020-01-06 DIAGNOSIS — R609 Edema, unspecified: Secondary | ICD-10-CM

## 2020-01-06 DIAGNOSIS — I10 Essential (primary) hypertension: Secondary | ICD-10-CM | POA: Diagnosis not present

## 2020-01-06 DIAGNOSIS — H9201 Otalgia, right ear: Secondary | ICD-10-CM

## 2020-01-06 NOTE — Patient Instructions (Signed)
.   Please review the attached list of medications and notify my office if there are any errors.   . Please bring all of your medications to every appointment so we can make sure that our medication list is the same as yours.   

## 2020-01-07 LAB — LIPID PANEL
Chol/HDL Ratio: 1.8 ratio (ref 0.0–4.4)
Cholesterol, Total: 176 mg/dL (ref 100–199)
HDL: 100 mg/dL (ref >39)
LDL Chol Calc (NIH): 28 mg/dL (ref 0–99)
Triglycerides: 345 mg/dL — ABNORMAL HIGH (ref 0–149)
VLDL Cholesterol Cal: 48 mg/dL — ABNORMAL HIGH (ref 5–40)

## 2020-01-07 LAB — COMPREHENSIVE METABOLIC PANEL
ALT: 15 IU/L (ref 0–32)
AST: 30 IU/L (ref 0–40)
Albumin/Globulin Ratio: 1.5 (ref 1.2–2.2)
Albumin: 4.2 g/dL (ref 3.8–4.8)
Alkaline Phosphatase: 58 IU/L (ref 39–117)
BUN/Creatinine Ratio: 13 (ref 9–23)
BUN: 9 mg/dL (ref 6–24)
Bilirubin Total: 0.3 mg/dL (ref 0.0–1.2)
CO2: 17 mmol/L — ABNORMAL LOW (ref 20–29)
Calcium: 8.7 mg/dL (ref 8.7–10.2)
Chloride: 103 mmol/L (ref 96–106)
Creatinine, Ser: 0.69 mg/dL (ref 0.57–1.00)
GFR calc Af Amer: 122 mL/min/{1.73_m2} (ref >59)
GFR calc non Af Amer: 105 mL/min/{1.73_m2} (ref >59)
Globulin, Total: 2.8 g/dL (ref 1.5–4.5)
Glucose: 93 mg/dL (ref 65–99)
Potassium: 4.7 mmol/L (ref 3.5–5.2)
Sodium: 135 mmol/L (ref 134–144)
Total Protein: 7 g/dL (ref 6.0–8.5)

## 2020-01-07 LAB — CBC
Hematocrit: 31.2 % — ABNORMAL LOW (ref 34.0–46.6)
Hemoglobin: 10.2 g/dL — ABNORMAL LOW (ref 11.1–15.9)
MCH: 31.5 pg (ref 26.6–33.0)
MCHC: 32.7 g/dL (ref 31.5–35.7)
MCV: 96 fL (ref 79–97)
Platelets: 260 10*3/uL (ref 150–450)
RBC: 3.24 x10E6/uL — ABNORMAL LOW (ref 3.77–5.28)
RDW: 19.2 % — ABNORMAL HIGH (ref 11.7–15.4)
WBC: 4.4 10*3/uL (ref 3.4–10.8)

## 2020-01-07 LAB — T4, FREE: Free T4: 1.19 ng/dL (ref 0.82–1.77)

## 2020-01-07 LAB — TSH: TSH: 0.976 u[IU]/mL (ref 0.450–4.500)

## 2020-01-10 ENCOUNTER — Encounter: Payer: Self-pay | Admitting: Family Medicine

## 2020-01-10 ENCOUNTER — Telehealth: Payer: Self-pay

## 2020-01-10 DIAGNOSIS — D649 Anemia, unspecified: Secondary | ICD-10-CM | POA: Insufficient documentation

## 2020-01-10 DIAGNOSIS — I10 Essential (primary) hypertension: Secondary | ICD-10-CM

## 2020-01-10 LAB — IRON AND TIBC
Iron Saturation: 23 % (ref 15–55)
Iron: 86 ug/dL (ref 27–159)
Total Iron Binding Capacity: 378 ug/dL (ref 250–450)
UIBC: 292 ug/dL (ref 131–425)

## 2020-01-10 LAB — SPECIMEN STATUS REPORT

## 2020-01-10 LAB — FERRITIN: Ferritin: 35 ng/mL (ref 15–150)

## 2020-01-10 MED ORDER — ATENOLOL-CHLORTHALIDONE 50-25 MG PO TABS: 1.0000 | ORAL_TABLET | Freq: Every day | ORAL | 1 refills | Status: DC

## 2020-01-10 MED ORDER — AMLODIPINE BESYLATE 5 MG PO TABS: 5.0000 mg | ORAL_TABLET | Freq: Every day | ORAL | 1 refills | Status: DC

## 2020-01-10 NOTE — Telephone Encounter (Signed)
-----   Message from [name], MD sent at 01/10/2020  7:29 AM EDT ----- Is a little bit anemic, but normal iron levels. Otherwise labs are normal.  Recommend changing atenolol to atenolol-chlorthalidone 50/25 #90 rf x 1, and reducing amlodipine to 5mg  daily, #90 rf x 1 and schedule follow up for BP check in 3 months.

## 2020-01-10 NOTE — Telephone Encounter (Signed)
Patient advised. RX sent to [facility]

## 2020-04-11 NOTE — Progress Notes (Deleted)
     Established patient visit   Patient: Kara Murray   DOB: [birth year]   44 y.o. Female  MRN: [id number] Visit Date: 04/13/2020  Today's healthcare provider: Mila Merry, MD   No chief complaint on file.  Subjective    HPI  Hypertension, follow-up  BP Readings from Last 3 Encounters:  01/06/20 132/83  09/20/19 116/80  01/20/18 130/90   Wt Readings from Last 3 Encounters:  01/06/20 219 lb 9.6 oz (99.6 kg)  09/20/19 207 lb (93.9 kg)  01/20/18 204 lb 6.4 oz (92.7 kg)     She was last seen for hypertension 3 months ago.  BP at that visit was 132/83. Management since that visit includes changing atenolol to atenolol-chlorthalidone 50/25, and reducing amlodipine to 5mg  daily.  She reports good compliance with treatment. She is not having side effects.  She is following a Regular diet. She *** exercising. She does not smoke.  Use of agents associated with hypertension: none.   Outside blood pressures are {***enter patient reported home BP readings, or 'not being checked':1}. Symptoms: No chest pain No chest pressure  No palpitations No syncope  No dyspnea No orthopnea  No paroxysmal nocturnal dyspnea No lower extremity edema   Pertinent labs: Lab Results  Component Value Date   CHOL 176 01/06/2020   HDL 100 01/06/2020   LDLCALC 28 01/06/2020   TRIG 345 (H) 01/06/2020   CHOLHDL 1.8 01/06/2020   Lab Results  Component Value Date   NA 135 01/06/2020   K 4.7 01/06/2020   CREATININE 0.69 01/06/2020   GFRNONAA 105 01/06/2020   GFRAA 122 01/06/2020   GLUCOSE 93 01/06/2020     The 10-year ASCVD risk score 01/08/2020 DC Jr., et al., 2013) is: 0.8%   ---------------------------------------------------------------------------------------------------   {Show patient history (optional):23778::" "}   Medications: Outpatient Medications Prior to Visit  Medication Sig  . amLODipine (NORVASC) 5 MG tablet Take 1 tablet (5 mg total) by mouth daily.  2014  atenolol-chlorthalidone (TENORETIC) 50-25 MG tablet Take 1 tablet by mouth daily.  Marland Kitchen levonorgestrel (MIRENA) 20 MCG/24HR IUD 1 Intra Uterine Device (1 each total) by Intrauterine route once for 1 dose.   No facility-administered medications prior to visit.    Review of Systems  Constitutional: Negative.   Respiratory: Negative.   Cardiovascular: Negative.   Musculoskeletal: Negative.     {Heme  Chem  Endocrine  Serology  Results Review (optional):23779::" "}  Objective    There were no vitals taken for this visit. {Show previous vital signs (optional):23777::" "}  Physical Exam   General: Appearance:    Severely obese female in no acute distress  Eyes:    PERRL, conjunctiva/corneas clear, EOM's intact       Lungs:     Clear to auscultation bilaterally, respirations unlabored  Heart:    Normal heart rate. Normal rhythm. No murmurs, rubs, or gallops.   MS:   All extremities are intact.   Neurologic:   Awake, alert, oriented x 3. No apparent focal neurological           defect.         No results found for any visits on 04/13/20.  Assessment & Plan     ***  No follow-ups on file.      {provider attestation***:1}   06/13/20, MD  [facility] [phone] (phone) [phone] (fax)  [facility]

## 2020-04-13 ENCOUNTER — Other Ambulatory Visit: Payer: Self-pay

## 2020-04-13 ENCOUNTER — Ambulatory Visit: Payer: BC Managed Care – PPO | Admitting: Family Medicine

## 2020-04-13 DIAGNOSIS — Z0283 Encounter for blood-alcohol and blood-drug test: Secondary | ICD-10-CM

## 2020-04-13 DIAGNOSIS — M7712 Lateral epicondylitis, left elbow: Secondary | ICD-10-CM | POA: Insufficient documentation

## 2020-04-13 NOTE — Progress Notes (Signed)
[facility] Specimen ID: [id number]

## 2020-07-09 ENCOUNTER — Ambulatory Visit (INDEPENDENT_AMBULATORY_CARE_PROVIDER_SITE_OTHER): Payer: BC Managed Care – PPO | Admitting: Physician Assistant

## 2020-07-09 ENCOUNTER — Ambulatory Visit (INDEPENDENT_AMBULATORY_CARE_PROVIDER_SITE_OTHER): Payer: BC Managed Care – PPO | Admitting: Family Medicine

## 2020-07-09 ENCOUNTER — Encounter: Payer: Self-pay | Admitting: Physician Assistant

## 2020-07-09 VITALS — Temp 97.5°F

## 2020-07-09 DIAGNOSIS — J0140 Acute pansinusitis, unspecified: Secondary | ICD-10-CM | POA: Diagnosis not present

## 2020-07-09 MED ORDER — AMOXICILLIN-POT CLAVULANATE 875-125 MG PO TABS: 1.0000 | ORAL_TABLET | Freq: Two times a day (BID) | ORAL | 0 refills | Status: DC

## 2020-07-09 NOTE — Progress Notes (Signed)
Virtual telephone visit    Virtual Visit via Telephone Note   This visit type was conducted due to national recommendations for restrictions regarding the COVID-19 Pandemic (e.g. social distancing) in an effort to limit this patient's exposure and mitigate transmission in our community. Due to her co-morbid illnesses, this patient is at least at moderate risk for complications without adequate follow up. This format is felt to be most appropriate for this patient at this time. The patient did not have access to video technology or had technical difficulties with video requiring transitioning to audio format only (telephone). Physical exam was limited to content and character of the telephone converstion.    Patient location: Home Provider location: [facility]  I discussed the limitations of evaluation and management by telemedicine and the availability of in person appointments. The patient expressed understanding and agreed to proceed.   Visit Date: 07/09/2020  Today's healthcare provider: Margaretann Loveless, PA-C   Chief Complaint  Patient presents with  . Sore Throat   Subjective    Sore Throat  This is a new problem. The current episode started in the past 7 days. The problem has been unchanged. There has been no fever. Associated symptoms include diarrhea (this started today), ear pain, headaches and trouble swallowing. Pertinent negatives include no coughing, shortness of breath or vomiting. She has tried acetaminophen for the symptoms. The treatment provided no relief.    Patient has had covid 19 in Nov 2020. Patient has covid test on Friday. Patient was vaccinated in April.   Patient Active Problem List   Diagnosis Date Noted  . Normocytic anemia 01/10/2020  . Osteoarthritis of knee 11/13/2017  . Localized, primary osteoarthritis 11/13/2017  . 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 Hypertension 05/29/2015   Past Medical History:  Diagnosis Date  . Hypertension        Medications: Outpatient Medications Prior to Visit  Medication Sig  . amLODipine (NORVASC) 5 MG tablet Take 1 tablet (5 mg total) by mouth daily.  Marland Kitchen atenolol-chlorthalidone (TENORETIC) 50-25 MG tablet Take 1 tablet by mouth daily.  Marland Kitchen levonorgestrel (MIRENA) 20 MCG/24HR IUD 1 Intra Uterine Device (1 each total) by Intrauterine route once for 1 dose.   No facility-administered medications prior to visit.    Review of Systems  Constitutional: Negative for chills and fever.  HENT: Positive for ear pain, postnasal drip, sinus pressure, sinus pain, sneezing, sore throat and trouble swallowing.   Respiratory: Negative for cough, chest tightness, shortness of breath and wheezing.   Gastrointestinal: Positive for diarrhea (this started today). Negative for vomiting.  Neurological: Positive for headaches.    Last CBC Lab Results  Component Value Date   WBC 4.4 01/06/2020   HGB 10.2 (L) 01/06/2020   HCT 31.2 (L) 01/06/2020   MCV 96 01/06/2020   MCH 31.5 01/06/2020   RDW 19.2 (H) 01/06/2020   PLT 260 01/06/2020   Last metabolic panel Lab Results  Component Value Date   GLUCOSE 93 01/06/2020   NA 135 01/06/2020   K 4.7 01/06/2020   CL 103 01/06/2020   CO2 17 (L) 01/06/2020   BUN 9 01/06/2020   CREATININE 0.69 01/06/2020   GFRNONAA 105 01/06/2020   GFRAA 122 01/06/2020   CALCIUM 8.7 01/06/2020   PROT 7.0 01/06/2020   ALBUMIN 4.2 01/06/2020   LABGLOB 2.8 01/06/2020   AGRATIO 1.5 01/06/2020   BILITOT 0.3 01/06/2020   ALKPHOS 58 01/06/2020   AST 30 01/06/2020   ALT 15 01/06/2020  ANIONGAP 10 08/09/2015      Objective    There were no vitals taken for this visit. BP Readings from Last 3 Encounters:  01/06/20 132/83  09/20/19 116/80  01/20/18 130/90   Wt Readings from Last 3 Encounters:  01/06/20 219 lb 9.6 oz (99.6 kg)  09/20/19 207 lb (93.9 kg)  01/20/18 204 lb 6.4 oz (92.7 kg)        Assessment & Plan     1. Acute non-recurrent pansinusitis Worsening  symptoms that have not responded to OTC medications. Will give augmentin as below. Continue allergy medications. Stay well hydrated and get plenty of rest. Call if no symptom improvement or if symptoms worsen. - amoxicillin-clavulanate (AUGMENTIN) 875-125 MG tablet; Take 1 tablet by mouth 2 (two) times daily.  Dispense: 20 tablet; Refill: 0   No follow-ups on file.    I discussed the assessment and treatment plan with the patient. The patient was provided an opportunity to ask questions and all were answered. The patient agreed with the plan and demonstrated an understanding of the instructions.   The patient was advised to call back or seek an in-person evaluation if the symptoms worsen or if the condition fails to improve as anticipated.  I provided 11 minutes of non-face-to-face time during this encounter.  Delmer Islam, PA-C, have reviewed all documentation for this visit. The documentation on 07/09/20 for the exam, diagnosis, procedures, and orders are all accurate and complete.   [facility] [phone] (phone) [phone] (fax)  [facility]

## 2020-07-14 ENCOUNTER — Other Ambulatory Visit: Payer: Self-pay | Admitting: Family Medicine

## 2020-07-14 DIAGNOSIS — I10 Essential (primary) hypertension: Secondary | ICD-10-CM

## 2020-07-23 ENCOUNTER — Ambulatory Visit (INDEPENDENT_AMBULATORY_CARE_PROVIDER_SITE_OTHER): Payer: BC Managed Care – PPO | Admitting: Family Medicine

## 2020-08-11 ENCOUNTER — Other Ambulatory Visit: Payer: Self-pay | Admitting: Family Medicine

## 2020-08-11 DIAGNOSIS — I10 Essential (primary) hypertension: Secondary | ICD-10-CM

## 2020-08-11 NOTE — Telephone Encounter (Signed)
Requested medication (s) are due for refill today: yes  Requested medication (s) are on the active medication list: yes  Last refill:  amlodipine: 07/14/20        atenolol: 07/14/20   Future visit scheduled: no  Notes to clinic:  called pt and LM on VM to all office to schedule appt   Requested Prescriptions  Pending Prescriptions Disp Refills   amLODipine (NORVASC) 5 MG tablet [Pharmacy Med Name: AMLODIPINE BESYLATE 5 MG TAB] 30 tablet 0    Sig: TAKE 1 TABLET BY MOUTH EVERY DAY      Cardiovascular:  Calcium Channel Blockers Failed - 08/11/2020  2:30 PM      Failed - Valid encounter within last 6 months    Recent Outpatient Visits           1 month ago Acute non-recurrent pansinusitis   [facility] Joycelyn Man M, PA-C   7 months ago Edema, unspecified type   [facility] [name], MD   9 months ago Acute otitis media, unspecified otitis media type   [facility] [name], MD   2 years ago Acute viral syndrome   [facility] [place], [place], [place]   2 years ago Screening for cholesterol level   [facility] [place], [place]              Passed - Last BP in normal range    BP Readings from Last 1 Encounters:  01/06/20 132/83            atenolol-chlorthalidone (TENORETIC) 50-25 MG tablet [Pharmacy Med Name: ATENOLOL-CHLORTHALIDONE 50-25] 30 tablet 0    Sig: TAKE 1 TABLET BY MOUTH EVERY DAY      Cardiovascular: Beta Blocker + Diuretic Combos Failed - 08/11/2020  2:30 PM      Failed - K in normal range and within 180 days    Potassium  Date Value Ref Range Status  01/06/2020 4.7 3.5 - 5.2 mmol/L Final          Failed - Na in normal range and within 180 days    Sodium  Date Value Ref Range Status  01/06/2020 135 134 - 144 mmol/L Final          Failed - Cr in normal range and within 180 days    Creatinine, Ser  Date Value Ref Range Status  01/06/2020 0.69 0.57 - 1.00  mg/dL Final          Failed - Ca in normal range and within 180 days    Calcium  Date Value Ref Range Status  01/06/2020 8.7 8.7 - 10.2 mg/dL Final          Failed - Valid encounter within last 6 months    Recent Outpatient Visits           1 month ago Acute non-recurrent pansinusitis   [facility] Joycelyn Man M, [place]   7 months ago Edema, unspecified type   [facility] [name], MD   9 months ago Acute otitis media, unspecified otitis media type   [facility] [name], MD   2 years ago Acute viral syndrome   [facility] [place], [place]   2 years ago Screening for cholesterol level   [facility] [place], [place]              [id number] - Patient is not pregnant  Passed - Last BP in normal range    BP Readings from Last 1 Encounters:  01/06/20 132/83          Passed - Last Heart Rate in normal range    Pulse Readings from Last 1 Encounters:  01/06/20 94

## 2020-08-15 NOTE — Telephone Encounter (Signed)
Patient has follow up appointment scheduled 09/05/2020. Courtesy refill was given last month. Please advise if ok to give another courtesy refill to get by until office visit.

## 2020-08-29 ENCOUNTER — Other Ambulatory Visit: Payer: Self-pay | Admitting: Family Medicine

## 2020-08-29 DIAGNOSIS — I10 Essential (primary) hypertension: Secondary | ICD-10-CM

## 2020-08-29 NOTE — Telephone Encounter (Signed)
Requested medication (s) are due for refill today: No  Requested medication (s) are on the active medication list: Yes  Last refill:  08/15/20  Future visit scheduled: Yes  Notes to clinic:  Pharmacy requests 90 day supply.    Requested Prescriptions  Pending Prescriptions Disp Refills   atenolol-chlorthalidone (TENORETIC) 50-25 MG tablet [Pharmacy Med Name: ATENOLOL-CHLORTHALIDONE 50-25] 90 tablet 1    Sig: TAKE 1 TABLET BY MOUTH EVERY DAY      Cardiovascular: Beta Blocker + Diuretic Combos Failed - 08/29/2020  9:37 AM      Failed - K in normal range and within 180 days    Potassium  Date Value Ref Range Status  01/06/2020 4.7 3.5 - 5.2 mmol/L Final          Failed - Na in normal range and within 180 days    Sodium  Date Value Ref Range Status  01/06/2020 135 134 - 144 mmol/L Final          Failed - Cr in normal range and within 180 days    Creatinine, Ser  Date Value Ref Range Status  01/06/2020 0.69 0.57 - 1.00 mg/dL Final          Failed - Ca in normal range and within 180 days    Calcium  Date Value Ref Range Status  01/06/2020 8.7 8.7 - 10.2 mg/dL Final          Failed - Valid encounter within last 6 months    Recent Outpatient Visits           1 month ago Acute non-recurrent pansinusitis   [facility] Joycelyn Man M, PA-C   7 months ago Edema, unspecified type   [facility] [name], MD   9 months ago Acute otitis media, unspecified otitis media type   [facility] [name], MD   2 years ago Acute viral syndrome   [facility] [place], [place], [place]   2 years ago Screening for cholesterol level   [facility] [place], [place], [place]       Future Appointments             In 1 week Sherrie Mustache, Demetrios Isaacs, MD [facility]             Passed - Patient is not pregnant      Passed - Last BP in normal range    BP Readings from Last 1  Encounters:  01/06/20 132/83          Passed - Last Heart Rate in normal range    Pulse Readings from Last 1 Encounters:  01/06/20 94

## 2020-08-29 NOTE — Telephone Encounter (Signed)
90 day supply not appropriate. Patient is over due for follow up appointment. She has appointment scheduled for 09/05/2020. Last refill that was sent into pharmacy on 08/15/2020 was the 2nd courtesy refill given to get her by until her appointment.

## 2020-09-04 NOTE — Progress Notes (Deleted)
      Established patient visit   Patient: Kara Murray   DOB: [birth year]   44 y.o. Female  MRN: [id number] Visit Date: 09/05/2020  Today's healthcare provider: Mila Merry, MD   No chief complaint on file.  Subjective    HPI  Hypertension, follow-up  BP Readings from Last 3 Encounters:  01/06/20 132/83  09/20/19 116/80  01/20/18 130/90   Wt Readings from Last 3 Encounters:  01/06/20 219 lb 9.6 oz (99.6 kg)  09/20/19 207 lb (93.9 kg)  01/20/18 204 lb 6.4 oz (92.7 kg)     She was last seen for hypertension 8 months ago.  BP at that visit was 132/83. Management since that visit includes changing atenolol to atenolol-chlorthalidone 50/25mg  daily and reduced amlodipine to 5mg  daily.  She reports {excellent/good/fair/poor:19665} compliance with treatment. She {is/is not:9024} having side effects. {document side effects if present:1} She is following a {diet:21022986} diet. She {is/is not:9024} exercising. She {does/does not:200015} smoke.  Use of agents associated with hypertension: none.   Outside blood pressures are {***enter patient reported home BP readings, or 'not being checked':1}. Symptoms: {Yes/No:20286} chest pain {Yes/No:20286} chest pressure  {Yes/No:20286} palpitations {Yes/No:20286} syncope  {Yes/No:20286} dyspnea {Yes/No:20286} orthopnea  {Yes/No:20286} paroxysmal nocturnal dyspnea {Yes/No:20286} lower extremity edema   Pertinent labs: Lab Results  Component Value Date   CHOL 176 01/06/2020   HDL 100 01/06/2020   LDLCALC 28 01/06/2020   TRIG 345 (H) 01/06/2020   CHOLHDL 1.8 01/06/2020   Lab Results  Component Value Date   NA 135 01/06/2020   K 4.7 01/06/2020   CREATININE 0.69 01/06/2020   GFRNONAA 105 01/06/2020   GFRAA 122 01/06/2020   GLUCOSE 93 01/06/2020     The 10-year ASCVD risk score 01/08/2020 DC Jr., et al., 2013) is: 0.8%    ---------------------------------------------------------------------------------------------------  {Show patient history (optional):23778::" "}   Medications: Outpatient Medications Prior to Visit  Medication Sig  . amLODipine (NORVASC) 5 MG tablet TAKE 1 TABLET BY MOUTH EVERY DAY  . amoxicillin-clavulanate (AUGMENTIN) 875-125 MG tablet Take 1 tablet by mouth 2 (two) times daily.  2014 atenolol-chlorthalidone (TENORETIC) 50-25 MG tablet TAKE 1 TABLET BY MOUTH EVERY DAY  . levonorgestrel (MIRENA) 20 MCG/24HR IUD 1 Intra Uterine Device (1 each total) by Intrauterine route once for 1 dose.   No facility-administered medications prior to visit.    Review of Systems  Constitutional: Negative for appetite change, chills, fatigue and fever.  Respiratory: Negative for chest tightness and shortness of breath.   Cardiovascular: Negative for chest pain and palpitations.  Gastrointestinal: Negative for abdominal pain, nausea and vomiting.  Neurological: Negative for dizziness and weakness.    {Heme  Chem  Endocrine  Serology  Results Review (optional):23779::" "}  Objective    There were no vitals taken for this visit. {Show previous vital signs (optional):23777::" "}  Physical Exam  ***  No results found for any visits on 09/05/20.  Assessment & Plan     ***  No follow-ups on file.      {provider attestation***:1}   09/07/20, MD  [facility] [phone] (phone) [phone] (fax)  [facility]

## 2020-09-05 ENCOUNTER — Ambulatory Visit: Payer: BC Managed Care – PPO | Admitting: Family Medicine

## 2020-12-19 ENCOUNTER — Other Ambulatory Visit: Payer: Self-pay | Admitting: Family Medicine

## 2020-12-19 DIAGNOSIS — I10 Essential (primary) hypertension: Secondary | ICD-10-CM

## 2020-12-19 NOTE — Telephone Encounter (Signed)
Requested medication (s) are due for refill today: yes  Requested medication (s) are on the active medication list: yes  Last refill: 11/20/2020  Future visit scheduled: no  Notes to clinic: overdue for follow up  Spoke with patient and she states that she will callback to schedule appt    Requested Prescriptions  Pending Prescriptions Disp Refills   amLODipine (NORVASC) 5 MG tablet [Pharmacy Med Name: AMLODIPINE BESYLATE 5 MG TAB] 30 tablet 0    Sig: TAKE 1 TABLET BY MOUTH EVERY DAY      Cardiovascular:  Calcium Channel Blockers Failed - 12/19/2020 10:32 AM      Failed - Valid encounter within last 6 months    Recent Outpatient Visits           5 months ago Acute non-recurrent pansinusitis   [facility] Joycelyn Man M, [place]   11 months ago Edema, unspecified type   [facility] [name], [facility]   1 year ago Acute otitis media, unspecified otitis media type   [facility] [name], MD   2 years ago Acute viral syndrome   [facility] [place], [place], [place]   3 years ago Screening for cholesterol level   [facility] [place], [place]                Passed - Last BP in normal range    BP Readings from Last 1 Encounters:  01/06/20 132/83            atenolol-chlorthalidone (TENORETIC) 50-25 MG tablet [Pharmacy Med Name: ATENOLOL-CHLORTHALIDONE 50-25] 30 tablet 1    Sig: TAKE 1 TABLET BY MOUTH EVERY DAY      Cardiovascular: Beta Blocker + Diuretic Combos Failed - 12/19/2020 10:32 AM      Failed - K in normal range and within 180 days    Potassium  Date Value Ref Range Status  01/06/2020 4.7 3.5 - 5.2 mmol/L Final          Failed - Na in normal range and within 180 days    Sodium  Date Value Ref Range Status  01/06/2020 135 134 - 144 mmol/L Final          Failed - Cr in normal range and within 180 days    Creatinine, Ser  Date Value Ref Range Status  01/06/2020 0.69  0.57 - 1.00 mg/dL Final          Failed - Ca in normal range and within 180 days    Calcium  Date Value Ref Range Status  01/06/2020 8.7 8.7 - 10.2 mg/dL Final          Failed - Valid encounter within last 6 months    Recent Outpatient Visits           5 months ago Acute non-recurrent pansinusitis   [facility] Joycelyn Man M, [place]   11 months ago Edema, unspecified type   [facility] [name], MD   1 year ago Acute otitis media, unspecified otitis media type   [facility] [name], MD   2 years ago Acute viral syndrome   [facility] [place], [place]   3 years ago Screening for cholesterol level   [facility] [place], [place]                YIFOYD - Patient is not pregnant      Passed - Last BP  in normal range    BP Readings from Last 1 Encounters:  01/06/20 132/83          Passed - Last Heart Rate in normal range    Pulse Readings from Last 1 Encounters:  01/06/20 94

## 2020-12-31 NOTE — Progress Notes (Signed)
.      Established patient visit   Patient: Kara Murray   DOB: Oct 14, 1975   45 y.o. Female  MRN: [id number] Visit Date: 01/01/2021  Today's healthcare provider: Dortha Kern, PA-C   Chief Complaint  Patient presents with  . Hypertension  I,Porsha C McClurkin,acting as a Neurosurgeon for [employer], PA-C.,have documented all relevant documentation on the behalf of Dortha Kern, PA-C,as directed by  [employer], PA-C while in the presence of [employer], PA-C. Subjective    HPI  Hypertension, follow-up  BP Readings from Last 3 Encounters:  01/01/21 124/85  01/06/20 132/83  09/20/19 116/80   Wt Readings from Last 3 Encounters:  01/01/21 215 lb (97.5 kg)  01/06/20 219 lb 9.6 oz (99.6 kg)  09/20/19 207 lb (93.9 kg)     She was last seen for hypertension 1 years ago.  BP at that visit was 132/83. Management since that visit includes continue current medication.  She reports good compliance with treatment. She is not having side effects.  She is following a Regular diet. She is not exercising. She does not smoke.  Use of agents associated with hypertension: none.   Outside blood pressures are not being checked. Symptoms: No chest pain No chest pressure  No palpitations No syncope  No dyspnea No orthopnea  No paroxysmal nocturnal dyspnea No lower extremity edema   Pertinent labs: Lab Results  Component Value Date   CHOL 176 01/06/2020   HDL 100 01/06/2020   LDLCALC 28 01/06/2020   TRIG 345 (H) 01/06/2020   CHOLHDL 1.8 01/06/2020   Lab Results  Component Value Date   NA 135 01/06/2020   K 4.7 01/06/2020   CREATININE 0.69 01/06/2020   GFRNONAA 105 01/06/2020   GFRAA 122 01/06/2020   GLUCOSE 93 01/06/2020     The 10-year ASCVD risk score Denman George DC Jr., et al., 2013) is: 0.7%   ---------------------------------------------------------------------------------------------------   Past Medical History:  Diagnosis Date  . Hypertension     Past Surgical History:  Procedure Laterality Date  . biariactric surgery  2010  . KNEE SURGERY  1994, 2001  . THUMB FUSION Left 04/11/14  . TOE SURGERY  2008   Social History   Tobacco Use  . Smoking status: Never Smoker  . Smokeless tobacco: Never Used  Vaping Use  . Vaping Use: Never used  Substance Use Topics  . Alcohol use: No    Alcohol/week: 0.0 standard drinks  . Drug use: Never   Family History  Problem Relation Age of Onset  . Hypertension Mother   . Diabetes Mother   . Heart attack Father   . Hypertension Brother   . Breast cancer Maternal Aunt 50  . Breast cancer Cousin    No Known Allergies     Medications: Outpatient Medications Prior to Visit  Medication Sig  . amLODipine (NORVASC) 5 MG tablet TAKE 1 TABLET BY MOUTH EVERY DAY  . atenolol-chlorthalidone (TENORETIC) 50-25 MG tablet TAKE 1 TABLET BY MOUTH EVERY DAY  . amoxicillin-clavulanate (AUGMENTIN) 875-125 MG tablet Take 1 tablet by mouth 2 (two) times daily. (Patient not taking: Reported on 01/01/2021)  . levonorgestrel (MIRENA) 20 MCG/24HR IUD 1 Intra Uterine Device (1 each total) by Intrauterine route once for 1 dose.   No facility-administered medications prior to visit.    Review of Systems  Constitutional: Negative.   Respiratory: Negative.   Cardiovascular: Negative.   Hematological: Negative.        Objective    BP  124/85 (BP Location: Right Arm, Patient Position: Sitting, Cuff Size: Normal)   Pulse 82   Temp 98.2 F (36.8 C) (Oral)   Wt 215 lb (97.5 kg)   SpO2 100%   BMI 39.32 kg/m  BP Readings from Last 3 Encounters:  01/01/21 124/85  01/06/20 132/83  09/20/19 116/80   Wt Readings from Last 3 Encounters:  01/01/21 215 lb (97.5 kg)  01/06/20 219 lb 9.6 oz (99.6 kg)  09/20/19 207 lb (93.9 kg)       Physical Exam Constitutional:      General: She is not in acute distress.    Appearance: Normal appearance. She is well-developed. She is obese.  HENT:     Head:  Normocephalic and atraumatic.     Right Ear: Hearing and tympanic membrane normal.     Left Ear: Hearing and tympanic membrane normal.     Nose: Nose normal.  Eyes:     General: Lids are normal. No scleral icterus.       Right eye: No discharge.        Left eye: No discharge.     Conjunctiva/sclera: Conjunctivae normal.  Cardiovascular:     Rate and Rhythm: Normal rate and regular rhythm.  Pulmonary:     Effort: Pulmonary effort is normal. No respiratory distress.  Abdominal:     General: Bowel sounds are normal.     Palpations: Abdomen is soft.  Musculoskeletal:        General: Normal range of motion.     Cervical back: Neck supple.  Skin:    General: Skin is warm and dry.     Findings: No lesion or rash.  Neurological:     General: No focal deficit present.     Mental Status: She is alert and oriented to person, place, and time.  Psychiatric:        Mood and Affect: Mood normal.        Speech: Speech normal.        Behavior: Behavior normal.        Thought Content: Thought content normal.      No results found for any visits on 01/01/21.  Assessment & Plan     1. Essential hypertension Well controlled. Tolerating Amlodipine and Tenoretic without side effects. Recommend low fat salt restricted diet. Check labs and refilled medications. Follow up pending reports. - CBC with Differential/Platelet - Comprehensive metabolic panel - Lipid panel - TSH - amLODipine (NORVASC) 5 MG tablet; Take 1 tablet (5 mg total) by mouth daily.  Dispense: 90 tablet; Refill: 3 - atenolol-chlorthalidone (TENORETIC) 50-25 MG tablet; Take 1 tablet by mouth daily.  Dispense: 90 tablet; Refill: 3  2. Hypertriglyceridemia Follow up labs. Plans recheck with bariatric surgeon regarding return of obesity after surgery. - Comprehensive metabolic panel - Lipid panel - TSH   No follow-ups on file.      I, Arnesia Vincelette, PA-C, have reviewed all documentation for this visit. The documentation  on 01/01/21 for the exam, diagnosis, procedures, and orders are all accurate and complete.     Dortha Kern, PA-C  [facility] [employer] [phone] (phone) [phone] (fax)  [facility]

## 2021-01-01 ENCOUNTER — Other Ambulatory Visit: Payer: Self-pay

## 2021-01-01 ENCOUNTER — Encounter: Payer: Self-pay | Admitting: Family Medicine

## 2021-01-01 ENCOUNTER — Ambulatory Visit (INDEPENDENT_AMBULATORY_CARE_PROVIDER_SITE_OTHER): Payer: BC Managed Care – PPO | Admitting: Family Medicine

## 2021-01-01 VITALS — BP 124/85 | HR 82 | Temp 98.2°F | Wt 215.0 lb

## 2021-01-01 DIAGNOSIS — E781 Pure hyperglyceridemia: Secondary | ICD-10-CM | POA: Diagnosis not present

## 2021-01-01 DIAGNOSIS — I10 Essential (primary) hypertension: Secondary | ICD-10-CM | POA: Diagnosis not present

## 2021-01-01 MED ORDER — AMLODIPINE BESYLATE 5 MG PO TABS: 1.0000 | ORAL_TABLET | Freq: Every day | ORAL | 3 refills | Status: DC

## 2021-01-01 MED ORDER — ATENOLOL-CHLORTHALIDONE 50-25 MG PO TABS: 1.0000 | ORAL_TABLET | Freq: Every day | ORAL | 3 refills | Status: DC

## 2021-01-02 LAB — CBC WITH DIFFERENTIAL/PLATELET
Basophils Absolute: 0.1 10*3/uL (ref 0.0–0.2)
Basos: 1 %
EOS (ABSOLUTE): 0 10*3/uL (ref 0.0–0.4)
Eos: 0 %
Hematocrit: 32.8 % — ABNORMAL LOW (ref 34.0–46.6)
Hemoglobin: 11 g/dL — ABNORMAL LOW (ref 11.1–15.9)
Immature Grans (Abs): 0 10*3/uL (ref 0.0–0.1)
Immature Granulocytes: 0 %
Lymphocytes Absolute: 1.9 10*3/uL (ref 0.7–3.1)
Lymphs: 42 %
MCH: 30.3 pg (ref 26.6–33.0)
MCHC: 33.5 g/dL (ref 31.5–35.7)
MCV: 90 fL (ref 79–97)
Monocytes Absolute: 0.4 10*3/uL (ref 0.1–0.9)
Monocytes: 9 %
Neutrophils Absolute: 2.1 10*3/uL (ref 1.4–7.0)
Neutrophils: 48 %
Platelets: 320 10*3/uL (ref 150–450)
RBC: 3.63 x10E6/uL — ABNORMAL LOW (ref 3.77–5.28)
RDW: 19.6 % — ABNORMAL HIGH (ref 11.7–15.4)
WBC: 4.5 10*3/uL (ref 3.4–10.8)

## 2021-01-02 LAB — COMPREHENSIVE METABOLIC PANEL
ALT: 26 IU/L (ref 0–32)
AST: 48 IU/L — ABNORMAL HIGH (ref 0–40)
Albumin/Globulin Ratio: 1.8 (ref 1.2–2.2)
Albumin: 4.2 g/dL (ref 3.8–4.8)
Alkaline Phosphatase: 76 IU/L (ref 44–121)
BUN/Creatinine Ratio: 10 (ref 9–23)
BUN: 7 mg/dL (ref 6–24)
Bilirubin Total: 0.5 mg/dL (ref 0.0–1.2)
CO2: 21 mmol/L (ref 20–29)
Calcium: 8.8 mg/dL (ref 8.7–10.2)
Chloride: 94 mmol/L — ABNORMAL LOW (ref 96–106)
Creatinine, Ser: 0.68 mg/dL (ref 0.57–1.00)
Globulin, Total: 2.4 g/dL (ref 1.5–4.5)
Glucose: 93 mg/dL (ref 65–99)
Potassium: 3.5 mmol/L (ref 3.5–5.2)
Sodium: 132 mmol/L — ABNORMAL LOW (ref 134–144)
Total Protein: 6.6 g/dL (ref 6.0–8.5)
eGFR: 109 mL/min/{1.73_m2} (ref >59)

## 2021-01-02 LAB — LIPID PANEL
Chol/HDL Ratio: 2 ratio (ref 0.0–4.4)
Cholesterol, Total: 211 mg/dL — ABNORMAL HIGH (ref 100–199)
HDL: 104 mg/dL (ref >39)
LDL Chol Calc (NIH): 61 mg/dL (ref 0–99)
Triglycerides: 308 mg/dL — ABNORMAL HIGH (ref 0–149)
VLDL Cholesterol Cal: 46 mg/dL — ABNORMAL HIGH (ref 5–40)

## 2021-01-02 LAB — TSH: TSH: 1.21 u[IU]/mL (ref 0.450–4.500)

## 2021-01-15 ENCOUNTER — Other Ambulatory Visit: Payer: Self-pay

## 2021-01-15 ENCOUNTER — Ambulatory Visit
Admission: EM | Admit: 2021-01-15 | Discharge: 2021-01-15 | Disposition: A | Discharge status: Home / Self Care | Payer: BC Managed Care – PPO | Attending: Emergency Medicine | Admitting: Emergency Medicine

## 2021-01-15 DIAGNOSIS — J069 Acute upper respiratory infection, unspecified: Secondary | ICD-10-CM | POA: Diagnosis not present

## 2021-01-15 DIAGNOSIS — Z1152 Encounter for screening for COVID-19: Secondary | ICD-10-CM

## 2021-01-15 MED ORDER — BENZONATATE 100 MG PO CAPS: 100.0000 mg | ORAL_CAPSULE | Freq: Three times a day (TID) | ORAL | 0 refills | Status: DC | PRN

## 2021-01-15 NOTE — ED Triage Notes (Signed)
Pt also reports R ear fullness, states she always gets ear infections.

## 2021-01-15 NOTE — ED Provider Notes (Signed)
Renaldo Fiddler    CSN: [id number] Arrival date & time: 01/15/21  0841      History   Chief Complaint Chief Complaint  Patient presents with  . Nasal Congestion  . Cough    HPI Kara Murray is a 45 y.o. female.   Patient presents with 3-day history of ear pain, nasal congestion, sinus pressure, cough.  She denies fever, chills, rash, shortness of breath, vomiting, diarrhea, or other symptoms.  Treatment attempted at home with Tylenol and Mucinex.  Her medical history includes hypertension, anemia, osteoarthritis.  The history is provided by the patient and medical records.    Past Medical History:  Diagnosis Date  . Hypertension     Patient Active Problem List   Diagnosis Date Noted  . Normocytic anemia 01/10/2020  . Osteoarthritis of knee 11/13/2017  . Localized, primary osteoarthritis 11/13/2017  . 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 Hypertension 05/29/2015    Past Surgical History:  Procedure Laterality Date  . biariactric surgery  2010  . KNEE SURGERY  1994, 2001  . THUMB FUSION Left 04/11/14  . TOE SURGERY  2008    OB History    Gravida  4   Para  2   Term  2   Preterm  0   AB  2   Living  2     SAB  0   IAB  2   Ectopic  0   Multiple  0   Live Births  2            Home Medications    Prior to Admission medications   Medication Sig Start Date End Date Taking? Authorizing Provider  amLODipine (NORVASC) 5 MG tablet Take 1 tablet (5 mg total) by mouth daily. 01/01/21  Yes Chrismon, Jodell Cipro, PA-C  atenolol-chlorthalidone (TENORETIC) 50-25 MG tablet Take 1 tablet by mouth daily. 01/01/21  Yes Chrismon, Jodell Cipro, PA-C  benzonatate (TESSALON) 100 MG capsule Take 1 capsule (100 mg total) by mouth 3 (three) times daily as needed for cough. 01/15/21  Yes Mickie Bail, NP  levonorgestrel (MIRENA) 20 MCG/24HR IUD 1 Intra Uterine Device (1 each total) by Intrauterine route once for 1 dose. 01/06/18 01/06/18 Yes Copland, Ilona Sorrel,  PA-C    Family History Family History  Problem Relation Age of Onset  . Hypertension Mother   . Diabetes Mother   . Heart attack Father   . Hypertension Brother   . Breast cancer Maternal Aunt 50  . Breast cancer Cousin     Social History Social History   Tobacco Use  . Smoking status: Never Smoker  . Smokeless tobacco: Never Used  Vaping Use  . Vaping Use: Never used  Substance Use Topics  . Alcohol use: Yes    Alcohol/week: 0.0 standard drinks    Comment: socially   . Drug use: Never     Allergies   Patient has no known allergies.   Review of Systems Review of Systems  Constitutional: Negative for chills and fever.  HENT: Positive for congestion, ear pain and sinus pressure. Negative for sore throat.   Respiratory: Positive for cough. Negative for shortness of breath.   Cardiovascular: Negative for chest pain and palpitations.  Gastrointestinal: Negative for abdominal pain, diarrhea and vomiting.  Skin: Negative for color change and rash.  All other systems reviewed and are negative.    Physical Exam Triage Vital Signs ED Triage Vitals  Enc Vitals Group     BP  Pulse      Resp      Temp      Temp src      SpO2      Weight      Height      Head Circumference      Peak Flow      Pain Score      Pain Loc      Pain Edu?      Excl. in GC?    No data found.  Updated Vital Signs BP (!) 131/91 (BP Location: Left Arm)   Pulse 84   Temp 98.6 F (37 C) (Oral)   Resp 18   SpO2 98%   Visual Acuity Right Eye Distance:   Left Eye Distance:   Bilateral Distance:    Right Eye Near:   Left Eye Near:    Bilateral Near:     Physical Exam Vitals and nursing note reviewed.  Constitutional:      General: She is not in acute distress.    Appearance: She is well-developed. She is not ill-appearing.  HENT:     Head: Normocephalic and atraumatic.     Right Ear: Tympanic membrane normal.     Left Ear: Tympanic membrane normal.     Nose: Nose  normal.     Mouth/Throat:     Mouth: Mucous membranes are moist.     Pharynx: Oropharynx is clear.  Eyes:     Conjunctiva/sclera: Conjunctivae normal.  Cardiovascular:     Rate and Rhythm: Normal rate and regular rhythm.     Heart sounds: Normal heart sounds.  Pulmonary:     Effort: Pulmonary effort is normal. No respiratory distress.     Breath sounds: Normal breath sounds.  Abdominal:     Palpations: Abdomen is soft.     Tenderness: There is no abdominal tenderness.  Musculoskeletal:     Cervical back: Neck supple.  Skin:    General: Skin is warm and dry.     Findings: No rash.  Neurological:     General: No focal deficit present.     Mental Status: She is alert and oriented to person, place, and time.     Gait: Gait normal.  Psychiatric:        Mood and Affect: Mood normal.        Behavior: Behavior normal.      UC Treatments / Results  Labs (all labs ordered are listed, but only abnormal results are displayed) Labs Reviewed  NOVEL CORONAVIRUS, NAA    EKG   Radiology No results found.  Procedures Procedures (including critical care time)  Medications Ordered in UC Medications - No data to display  Initial Impression / Assessment and Plan / UC Course  I have reviewed the triage vital signs and the nursing notes.  Pertinent labs & imaging results that were available during my care of the patient were reviewed by me and considered in my medical decision making (see chart for details).   Viral URI with cough, COVID test.  Treating cough with Tessalon Perles.  COVID pending.  Instructed patient to self quarantine until the test result is back.  Discussed symptomatic treatment including Tylenol, rest, hydration.  Instructed patient to follow up with PCP if her symptoms are not improving.  Patient agrees to plan of care.    Final Clinical Impressions(s) / UC Diagnoses   Final diagnoses:  Viral URI with cough  Encounter for screening for COVID-19  Discharge Instructions     Take the Tessalon as needed for cough.    Your COVID test is pending.  You should self quarantine until the test result is back.    Take Tylenol as needed for fever or discomfort.  Rest and keep yourself hydrated.    Follow-up with your primary care provider if your symptoms are not improving.          ED Prescriptions    Medication Sig Dispense Auth. Provider   benzonatate (TESSALON) 100 MG capsule Take 1 capsule (100 mg total) by mouth 3 (three) times daily as needed for cough. 21 capsule Mickie Bail, NP     PDMP not reviewed this encounter.   Mickie Bail, NP 01/15/21 [phone]

## 2021-01-15 NOTE — Discharge Instructions (Signed)
Take the Tessalon as needed for cough.    Your COVID test is pending.  You should self quarantine until the test result is back.    Take Tylenol as needed for fever or discomfort.  Rest and keep yourself hydrated.    Follow-up with your primary care provider if your symptoms are not improving.     

## 2021-01-15 NOTE — ED Triage Notes (Signed)
Pt presents with cough, sinus pressure, nasal congestion x 3 days.  Has taken Mucinex, Tylenol which provided some relief.

## 2021-01-16 LAB — SARS-COV-2, NAA 2 DAY TAT

## 2021-01-16 LAB — NOVEL CORONAVIRUS, NAA: SARS-CoV-2, NAA: NOT DETECTED

## 2021-02-25 NOTE — Progress Notes (Deleted)
PCP:  [name], MD   No chief complaint on file.    HPI:      [name] is a 45 y.o. [id number] who LMP was No LMP recorded. (Menstrual status: IUD)., presents today for her annual examination.  Her menses are infrequent with IUD, lasting 1 days, light flow with wiping only.  Dysmenorrhea none. She does have vasomotor sx.  Sex activity: single partner, contraception - IUD. Mirena REplaced 01/06/18.  Last Pap: 11/30/17  Results were: no abnormalities /neg HPV DNA  Hx of STDs: chlamydia in college  Last mammogram: December 23, 2012  Results were: normal--routine follow-up in 12 months.  There is a FH of breast cancer in her mat aunt and cousin, genetic testing not indicated. There is no FH of ovarian cancer. The patient does do self-breast exams.  Tobacco use: The patient denies current or previous tobacco use. Alcohol use: social drinker No drug use.  Exercise: min active  She does get adequate calcium but not Vitamin D in her diet.  Labs with PCP.   Past Medical History:  Diagnosis Date   Hypertension     Past Surgical History:  Procedure Laterality Date   biariactric surgery  2010   KNEE SURGERY  1994, 2001   THUMB FUSION Left 04/11/14   TOE SURGERY  2008    Family History  Problem Relation Age of Onset   Hypertension Mother    Diabetes Mother    Heart attack Father    Hypertension Brother    Breast cancer Maternal Aunt 61   Breast cancer Cousin     Social History   Socioeconomic History   Marital status: Married    Spouse name: Not on file   Number of children: Not on file   Years of education: Not on file   Highest education level: Not on file  Occupational History   Not on file  Tobacco Use   Smoking status: Never   Smokeless tobacco: Never  Vaping Use   Vaping Use: Never used  Substance and Sexual Activity   Alcohol use: Yes    Alcohol/week: 0.0 standard drinks    Comment: socially    Drug use: Never   Sexual activity: Yes     Birth control/protection: I.U.D.    Comment: Mirena   Other Topics Concern   Not on file  Social History Narrative   Not on file   Social Determinants of Health   Financial Resource Strain: Not on file  Food Insecurity: Not on file  Transportation Needs: Not on file  Physical Activity: Not on file  Stress: Not on file  Social Connections: Not on file  Intimate Partner Violence: Not on file    Outpatient Medications Prior to Visit  Medication Sig Dispense Refill   amLODipine (NORVASC) 5 MG tablet Take 1 tablet (5 mg total) by mouth daily. 90 tablet 3   atenolol-chlorthalidone (TENORETIC) 50-25 MG tablet Take 1 tablet by mouth daily. 90 tablet 3   benzonatate (TESSALON) 100 MG capsule Take 1 capsule (100 mg total) by mouth 3 (three) times daily as needed for cough. 21 capsule 0   levonorgestrel (MIRENA) 20 MCG/24HR IUD 1 Intra Uterine Device (1 each total) by Intrauterine route once for 1 dose. 1 each 0   No facility-administered medications prior to visit.      ROS:  Review of Systems  Constitutional:  Negative for fatigue, fever and unexpected weight change.  Respiratory:  Negative for cough, shortness  of breath and wheezing.   Cardiovascular:  Negative for chest pain, palpitations and leg swelling.  Gastrointestinal:  Negative for blood in stool, constipation, diarrhea, nausea and vomiting.  Endocrine: Negative for cold intolerance, heat intolerance and polyuria.  Genitourinary:  Negative for dyspareunia, dysuria, flank pain, frequency, genital sores, hematuria, menstrual problem, pelvic pain, urgency, vaginal bleeding, vaginal discharge and vaginal pain.  Musculoskeletal:  Negative for back pain, joint swelling and myalgias.  Skin:  Negative for rash.  Neurological:  Negative for dizziness, syncope, light-headedness, numbness and headaches.  Hematological:  Negative for adenopathy.  Psychiatric/Behavioral:  Negative for agitation, confusion, sleep disturbance and  suicidal ideas. The patient is not nervous/anxious.   BREAST: No symptoms   Objective: There were no vitals taken for this visit.   Physical Exam Constitutional:      Appearance: She is well-developed.  Genitourinary:     Vulva normal.     No vaginal discharge, erythema or tenderness.      Right Adnexa: not tender and no mass present.    Left Adnexa: not tender and no mass present.    No cervical motion tenderness or polyp.     IUD strings visualized.     Uterus is not enlarged or tender.  Breasts:    Right: No mass, nipple discharge, skin change or tenderness.     Left: No mass, nipple discharge, skin change or tenderness.  Neck:     Thyroid: No thyromegaly.  Cardiovascular:     Rate and Rhythm: Normal rate and regular rhythm.     Heart sounds: Normal heart sounds. No murmur heard. Pulmonary:     Effort: Pulmonary effort is normal.     Breath sounds: Normal breath sounds.  Abdominal:     Palpations: Abdomen is soft.     Tenderness: There is no abdominal tenderness. There is no guarding.  Musculoskeletal:        General: Normal range of motion.     Cervical back: Normal range of motion.  Neurological:     General: No focal deficit present.     Mental Status: She is alert and oriented to person, place, and time.     Cranial Nerves: No cranial nerve deficit.  Skin:    General: Skin is warm and dry.  Psychiatric:        Mood and Affect: Mood normal.        Behavior: Behavior normal.        Thought Content: Thought content normal.        Judgment: Judgment normal.  Vitals reviewed.    Assessment/Plan: Encounter for annual routine gynecological examination  Encounter for routine checking of intrauterine contraceptive device (IUD)  Encounter for screening mammogram for malignant neoplasm of breast - Plan: 3D MAMMOGRAM SCREENING BILATERAL; pt to sched mammo  GYN counsel breast self exam, mammography screening, family planning choices, adequate intake of calcium and  vitamin D, diet and exercise     F/U   No follow-ups on file.  Cleophas Yoak B. Giavonni Fonder, PA-C 02/25/2021 2:58 PM

## 2021-02-26 ENCOUNTER — Ambulatory Visit: Payer: BC Managed Care – PPO | Admitting: Obstetrics and Gynecology

## 2021-06-10 ENCOUNTER — Telehealth: Payer: Self-pay

## 2021-06-10 NOTE — Telephone Encounter (Signed)
Copied from CRM [id number]. Topic: Appointment Scheduling - Scheduling Inquiry for Clinic >> Jun 10, 2021 12:25 PM Wyonia Hough E wrote: Reason for CRM: Pt is experiencing chest congestion, ear burning and head pressure/ she asked for a virtual appt asap / I advised pt of E visits via mychart but she would like to see if office can fit her in first/ please advise

## 2021-06-10 NOTE — Telephone Encounter (Signed)
I called and advised patient that our office doesn't not have any available appointments. I recommend urgent care or a telehealth visit with [facility]. Patient declined and states she has an appointment scheduled to [facility].

## 2021-07-31 ENCOUNTER — Other Ambulatory Visit: Payer: BC Managed Care – PPO

## 2021-07-31 ENCOUNTER — Ambulatory Visit: Payer: BC Managed Care – PPO | Admitting: Physician Assistant

## 2021-07-31 ENCOUNTER — Encounter: Payer: Self-pay | Admitting: Physician Assistant

## 2021-07-31 DIAGNOSIS — B349 Viral infection, unspecified: Secondary | ICD-10-CM

## 2021-07-31 DIAGNOSIS — Z1152 Encounter for screening for COVID-19: Secondary | ICD-10-CM

## 2021-07-31 LAB — POCT INFLUENZA A/B
Influenza A, POC: NEGATIVE
Influenza B, POC: NEGATIVE

## 2021-07-31 LAB — POC COVID19 BINAXNOW: SARS Coronavirus 2 Ag: NEGATIVE

## 2021-07-31 MED ORDER — HYDROCOD POLST-CPM POLST ER 10-8 MG/5ML PO SUER: 5.0000 mL | Freq: Two times a day (BID) | ORAL | 0 refills | Status: DC

## 2021-07-31 NOTE — Progress Notes (Signed)
5 days of symptoms. Cough, sore throat, diarrhea, sneezing congested.

## 2021-07-31 NOTE — Progress Notes (Signed)
5 days of symptoms. Cough, sore throat, diarrhea, sneezing congested. 

## 2021-07-31 NOTE — Progress Notes (Signed)
   Subjective: Viral illness    Patient ID: Kara Murray, female    DOB: 17-Nov-1975, 45 y.o.   MRN: [id number]  HPI Patient presents with 2 days of cough, nasal congestion, sore throat, and body aches.  Patient is sore throat has improved overnight.  Patient tested negative for COVID-19 and influenza.  Patient said cough is keeping her from sleeping at night.   Review of Systems Hypertension    Objective:   Physical Exam  This is a virtual visit      Assessment & Plan: Viral illness with cough   Patient given a prescription for Tussionex and advised of the drowsy effects of the medication.

## 2021-08-01 LAB — SARS-COV-2, NAA 2 DAY TAT

## 2021-08-01 LAB — NOVEL CORONAVIRUS, NAA: SARS-CoV-2, NAA: NOT DETECTED

## 2021-12-23 ENCOUNTER — Ambulatory Visit: Payer: Self-pay

## 2021-12-23 NOTE — Telephone Encounter (Signed)
? ?  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 Pt is using bengay, and other otc counter ointments for pain. Pt has also use Tylenol with small amount of relief. Pt used a tens until which also provided some relief. ? ?Reason for Disposition ? [1] MODERATE back pain (e.g., interferes with normal activities) AND [2] present > 3 days ? ?Answer Assessment - Initial Assessment Questions ?1. ONSET: "When did the pain begin?"  ?    12/14/2021 ?2. LOCATION: "Where does it hurt?" (upper, mid or lower back) ?    Lower back  - right side ?3. SEVERITY: "How bad is the pain?"  (e.g., Scale 1-10; mild, moderate, or severe) ?  - MILD (1-3): doesn't interfere with normal activities  ?  - MODERATE (4-7): interferes with normal activities or awakens from sleep  ?  - SEVERE (8-10): excruciating pain, unable to do any normal activities  ?    8/10 ?4. PATTERN: "Is the pain constant?" (e.g., yes, no; constant, intermittent)  ?    constant ?5. RADIATION: "Does the pain shoot into your legs or elsewhere?" ?    overuse ?6. CAUSE:  "What do you think is causing the back pain?"  ?    overuse ?7. BACK OVERUSE:  "Any recent lifting of heavy objects, strenuous work or exercise?" ?    Yes mopping ?8. MEDICATIONS: "What have you taken so far for the pain?" (e.g., nothing, acetaminophen, NSAIDS) ?    Tylenol ?9. NEUROLOGIC SYMPTOMS: "Do you have any weakness, numbness, or problems with bowel/bladder control?" ?    no ?10. OTHER SYMPTOMS: "Do you have any other  symptoms?" (e.g., fever, abdominal pain, burning with urination, blood in urine) ?      no ?11. PREGNANCY: "Is there any chance you are pregnant?" (e.g., yes, no; LMP) ?      no ? ?Protocols used: Back Pain-A-AH ? ?

## 2021-12-24 ENCOUNTER — Encounter: Payer: Self-pay | Admitting: Physician Assistant

## 2021-12-24 ENCOUNTER — Ambulatory Visit: Payer: BC Managed Care – PPO | Admitting: Physician Assistant

## 2021-12-24 VITALS — BP 148/90 | HR 99 | Temp 98.6°F | Resp 16 | Ht 62.0 in | Wt 220.9 lb

## 2021-12-24 DIAGNOSIS — M545 Low back pain, unspecified: Secondary | ICD-10-CM | POA: Diagnosis not present

## 2021-12-24 NOTE — Patient Instructions (Addendum)
Based on your symptoms and physical exam I believe the following is the cause of your concern today ?Back pain likely secondary to a strain of your back muscles  ?I recommend the following at this time to help relieve that discomfort: ? ?Rest ?Warm compresses to the area (20 minutes on, minimum of 30 minutes off) ?You can alternate Tylenol and Ibuprofen for pain management but Ibuprofen is typically preferred to reduce inflammation.  ?You can also use Voltaren gel applied to the area to help with relief.  ?Gentle stretches and exercises that I have included in your paperwork ?Try to reduce excess strain to the area and rest as much as possible  ?Wear supportive shoes and, if you must lift anything, use proper lifting techniques that spare your back.  ?Try not to lift anything  over 20 lbs for the next 2-3 weeks  ? ? ?If these measures do not lead to improvement in your symptoms over the next 2-4 weeks please let us know    ? ?

## 2021-12-24 NOTE — Progress Notes (Signed)
?  ? ?I,Joseline E Rosas,acting as a scribe for [employer], PA-C.,have documented all relevant documentation on the behalf of Korah Hufstedler E Skye Rodarte, PA-C,as directed by  [employer], PA-C while in the presence of Jadin Kagel E Morris Markham, PA-C.  ? ?Established patient visit ? ? ?Patient: Kara Murray   DOB: 1976/02/08   46 y.o. Female  MRN: [id number] ?Visit Date: 12/24/2021 ? ?Today's healthcare provider: Oswaldo Conroy Daemyn Gariepy, PA-C  ?Introduced myself to the patient as a Secondary school teacher and provided education on APPs in clinical practice.  ? ? ?Chief Complaint  ?Patient presents with  ? Back Pain  ? ?Subjective  ?  ?HPI ?HPI   ? ? Back Pain   ?This is a new problem.  There was not an injury that may have caused the pain.  Recent episode started in the past 7 days.  The problem has been unchanged since onset.  Pain is lumbar spine.  The quality of pain is described as soreness.  Pain occurs daily.  Symptoms worse in nighttime.  The symptoms are aggravated by lying down.  Symptoms are relieved by nothing.  Treatments: application of heat, application of ice, acetaminophen and NSAIDs.  Treatment provided mild relief.  Abdominal Pain: Absent.  Bowel incontinence: Absent.  Chest pain: Absent.  Dysuria: Absent.  Fever: Absent.  Headaches: Absent.  Joint pains: Absent.  Weakness in leg: Absent.  Pelvic pain: Absent.  Tingling in lower extremities: Absent.  Urinary incontinence: Absent.  Weight loss: Absent. ? ?  ?  ?Last edited by Marjie Skiff, CMA on 12/24/2021 10:14 AM.  ?  ?  ?States symptoms started last sat ?Has had symptoms for almost a week ?Reports pain started after coming home from her second job ?Has had to stay home from work once already  ?Reports it is sore and located along right flank/mid thoracic area ?Pain level: 7/10 - tolerable but will flare if she makes an aggravating movemen ?She denies falls or trauma to the area  ?She has tried bengay, aspercreme, icyhot, massage, heating pad  ?Reports difficulty sleeping due to  pain ?Alleviating: heating pad, walking and moving around while at work ?Aggravating: sitting in her chair at work ? ? ? ?Medications: ?Outpatient Medications Prior to Visit  ?Medication Sig  ? amLODipine (NORVASC) 5 MG tablet Take 1 tablet (5 mg total) by mouth daily.  ? atenolol-chlorthalidone (TENORETIC) 50-25 MG tablet Take 1 tablet by mouth daily.  ? chlorpheniramine-HYDROcodone (TUSSIONEX PENNKINETIC ER) 10-8 MG/5ML SUER Take 5 mLs by mouth 2 (two) times daily.  ? levonorgestrel (MIRENA) 20 MCG/24HR IUD 1 Intra Uterine Device (1 each total) by Intrauterine route once for 1 dose.  ? ?No facility-administered medications prior to visit.  ? ? ?Review of Systems  ?Gastrointestinal:  Negative for abdominal pain.  ?Genitourinary:  Negative for decreased urine volume, difficulty urinating, dysuria, enuresis, flank pain, frequency, hematuria and urgency.  ?Musculoskeletal:  Positive for back pain.  ? ? ?  Objective  ?  ?BP (!) 148/90 (BP Location: Left Arm, Patient Position: Sitting, Cuff Size: Normal)   Pulse 99   Temp 98.6 ?F (37 ?C) (Oral)   Resp 16   Ht 5\' 2"  (1.575 m)   Wt 220 lb 14.4 oz (100.2 kg)   SpO2 99%   BMI 40.40 kg/m?  ? ? ?Physical Exam ?Vitals reviewed.  ?Constitutional:   ?   General: She is awake.  ?   Appearance: Normal appearance. She is well-developed and well-groomed. She is obese.  ?  HENT:  ?   Head: Normocephalic and atraumatic.  ?Eyes:  ?   General: Lids are normal.  ?   Conjunctiva/sclera: Conjunctivae normal.  ?Musculoskeletal:  ?   Cervical back: No signs of trauma.  ?   Thoracic back: Tenderness present. No swelling, deformity, signs of trauma or bony tenderness. Decreased range of motion. No scoliosis.  ?     Back: ? ?   Comments: Twisting and bending at hips and thoracic levels- ROM intact but uncomfortable with lateral movement to the left  ?No swelling, injury or ecchymosis noted on visual inspection ?No tenderness to spinal processes or SI joint  ?  ?Neurological:  ?   General:  No focal deficit present.  ?   Mental Status: She is alert and oriented to person, place, and time.  ?   GCS: GCS eye subscore is 4. GCS verbal subscore is 5. GCS motor subscore is 6.  ?   Motor: No tremor.  ?   Gait: Gait is intact.  ?Psychiatric:     ?   Attention and Perception: Attention normal.     ?   Mood and Affect: Mood normal.     ?   Speech: Speech normal.     ?   Behavior: Behavior normal. Behavior is cooperative.  ?  ? ? ?No results found for any visits on 12/24/21. ? Assessment & Plan  ?  ? ?Problem List Items Addressed This Visit   ?None ?Visit Diagnoses   ? ? Acute right-sided low back pain without sciatica    -  Primary ?Acute new problem x 1 week ?Denies trauma or injury to the area ?Denies associated urinary symptoms or radiation ?Recommend active recovery - gentle stretches, exercise, massage, warm compresses, Ibuprofen, Voltaren gel if desired ?Instructed patient to try OTC ibuprofen first - will send in script for meloxicam if Ibuprofen is not adequately managing pain ?If symptoms persist longer than 2- 4 weeks without improvement - may need imaging and referral to PT services  ?Follow up as needed for persistent or worsening symptoms   ? ?  ? ? ? ?No follow-ups on file. ? ? ?I, Aubryana Vittorio E Talitha Dicarlo, PA-C, have reviewed all documentation for this visit. The documentation on 12/24/21 for the exam, diagnosis, procedures, and orders are all accurate and complete. ? ? ?Debara Kamphuis, MHS, PA-C ?[facility] ?[facility]  ? ? ?

## 2022-01-02 ENCOUNTER — Telehealth: Payer: Self-pay | Admitting: Family Medicine

## 2022-01-02 NOTE — Telephone Encounter (Signed)
Patient due for HTN follow up. Patient advised and appointment scheduled for 01/10/2022 at 9:40am. Patient has enough medication to last until appointment.  ?

## 2022-01-02 NOTE — Telephone Encounter (Signed)
[facility] faxed refill request for the following medications: ? ?amLODipine (NORVASC) 5 MG tablet ? ?atenolol-chlorthalidone (TENORETIC) 50-25 MG tablet  ?Please advise. ? ?

## 2022-01-06 ENCOUNTER — Other Ambulatory Visit: Payer: Self-pay

## 2022-01-06 DIAGNOSIS — I10 Essential (primary) hypertension: Secondary | ICD-10-CM

## 2022-01-06 NOTE — Telephone Encounter (Signed)
[facility] faxed refill request for the following medications:  atenolol-chlorthalidone (TENORETIC) 50-25 MG tablet    Please advise.

## 2022-01-07 NOTE — Telephone Encounter (Signed)
FYI-Per [name]'s note ?"Patient due for HTN follow up. Patient advised and appointment scheduled for 01/10/2022 at 9:40am. Patient has enough medication to last until appointment. " ? ?Looks like patient canceled her appointment on 05/05 but is scheduled to see you tomorrow. ?

## 2022-01-08 ENCOUNTER — Ambulatory Visit: Payer: BC Managed Care – PPO | Admitting: Physician Assistant

## 2022-01-08 ENCOUNTER — Encounter: Payer: Self-pay | Admitting: Physician Assistant

## 2022-01-08 VITALS — BP 135/89 | HR 86 | Temp 98.5°F | Resp 16 | Ht 62.0 in | Wt 218.1 lb

## 2022-01-08 DIAGNOSIS — I10 Essential (primary) hypertension: Secondary | ICD-10-CM

## 2022-01-08 DIAGNOSIS — Z6839 Body mass index (BMI) 39.0-39.9, adult: Secondary | ICD-10-CM

## 2022-01-08 DIAGNOSIS — Z862 Personal history of diseases of the blood and blood-forming organs and certain disorders involving the immune mechanism: Secondary | ICD-10-CM | POA: Diagnosis not present

## 2022-01-08 DIAGNOSIS — Z8739 Personal history of other diseases of the musculoskeletal system and connective tissue: Secondary | ICD-10-CM

## 2022-01-08 DIAGNOSIS — Z833 Family history of diabetes mellitus: Secondary | ICD-10-CM

## 2022-01-08 DIAGNOSIS — E781 Pure hyperglyceridemia: Secondary | ICD-10-CM

## 2022-01-08 MED ORDER — ATENOLOL-CHLORTHALIDONE 50-25 MG PO TABS: 1.0000 | ORAL_TABLET | Freq: Every day | ORAL | 3 refills | Status: DC

## 2022-01-08 NOTE — Progress Notes (Signed)
?  ? ?I,Joseline E Rosas,acting as a scribe for [employer], PA-C.,have documented all relevant documentation on the behalf of Mikey Kirschner, PA-C,as directed by  Mikey Kirschner, PA-C while in the presence of Mikey Kirschner, PA-C.  ? ?Established patient visit ? ? ?Patient: Kara Murray   DOB: 03/11/1976   46 y.o. Female  MRN: [id number] ?Visit Date: 01/08/2022 ? ?Today's healthcare provider: Mikey Kirschner, PA-C  ? ?Chief Complaint  ?Patient presents with  ? follow-up HTN  ? ?Subjective  ?  ?HPI  ? ?Reports diagnosis of gout around christmastime, she reports she drinks heavily on the weekends, 5+ beers at a time. Drinks < 2 during the week.  ? ?Hypertension, follow-up ? ?BP Readings from Last 3 Encounters:  ?01/08/22 135/89  ?12/24/21 (!) 148/90  ?01/15/21 (!) 131/91  ? Wt Readings from Last 3 Encounters:  ?01/08/22 218 lb 1.6 oz (98.9 kg)  ?12/24/21 220 lb 14.4 oz (100.2 kg)  ?01/01/21 215 lb (97.5 kg)  ?  ? ?She was last seen for hypertension 12 months ago.  ?BP at that visit was 148/90. ?Management since that visit includes continue amlodipine and Tenoretic.Recommend low fat salt restricted diet. ?She reports excellent compliance with treatment. ?She is not having side effects.  ?She is exercising. ?She is adherent to low salt diet.   ?Outside blood pressures are not being checked. ? ?She does not smoke. ? ?--------------------------------------------------------------------------------------------------- ?Lipid/Cholesterol, follow-up ? ?Last Lipid Panel: ?Lab Results  ?Component Value Date  ? CHOL 211 (H) 01/01/2021  ? [place] 61 01/01/2021  ? HDL 104 01/01/2021  ? TRIG 308 (H) 01/01/2021  ? ? ?She was last seen for this 12 months ago.  ?Management since that visit includes start Simvastatin 40 mg qd #90 & 3 RF to help lipids. Recheck appointment in 3 months. Never started the Simvastatin. ? ?She reports excellent compliance with treatment. ?She is not having side effects.  ? ?Symptoms: ?No  appetite changes No foot ulcerations  ?No chest pain No chest pressure/discomfort  ?No dyspnea No orthopnea  ?No fatigue No lower extremity edema  ?No palpitations No paroxysmal nocturnal dyspnea  ?No nausea Yes numbness or tingling of extremity-sometimes after work  ?No polydipsia No polyuria  ?No speech difficulty No syncope  ? ?She is following a Regular diet. ?Current exercise: walking ? ?Last metabolic panel ?Lab Results  ?Component Value Date  ? GLUCOSE 93 01/01/2021  ? NA 132 (L) 01/01/2021  ? K 3.5 01/01/2021  ? BUN 7 01/01/2021  ? CREATININE 0.68 01/01/2021  ? EGFR 109 01/01/2021  ? GFRNONAA 105 01/06/2020  ? CALCIUM 8.8 01/01/2021  ? AST 48 (H) 01/01/2021  ? ALT 26 01/01/2021  ? ?The ASCVD Risk score (Arnett DK, et al., 2019) failed to calculate for the following reasons: ?  The valid HDL cholesterol range is 20 to 100 mg/dL ? ?---------------------------------------------------------------------------------------------------  ? ?Medications: ?Outpatient Medications Prior to Visit  ?Medication Sig  ? amLODipine (NORVASC) 5 MG tablet Take 1 tablet (5 mg total) by mouth daily.  ? atenolol-chlorthalidone (TENORETIC) 50-25 MG tablet Take 1 tablet by mouth daily.  ? [DISCONTINUED] chlorpheniramine-HYDROcodone (TUSSIONEX PENNKINETIC ER) 10-8 MG/5ML SUER Take 5 mLs by mouth 2 (two) times daily.  ? levonorgestrel (MIRENA) 20 MCG/24HR IUD 1 Intra Uterine Device (1 each total) by Intrauterine route once for 1 dose.  ? ?No facility-administered medications prior to visit.  ? ? ?Review of Systems  ?Constitutional:  Negative for appetite change, chills, fatigue and fever.  ?Respiratory:  Negative  for chest tightness and shortness of breath.   ?Cardiovascular:  Negative for chest pain and palpitations.  ?Gastrointestinal:  Negative for abdominal pain, nausea and vomiting.  ?Neurological:  Negative for dizziness and weakness.  ? ?  Objective  ?  ?BP 135/89 (BP Location: Left Arm, Patient Position: Sitting, Cuff Size:  Large)   Pulse 86   Temp 98.5 ?F (36.9 ?C) (Oral)   Resp 16   Ht 5' 2"  (1.575 m)   Wt 218 lb 1.6 oz (98.9 kg)   BMI 39.89 kg/m?  ? ?Physical Exam ?Constitutional:   ?   General: She is awake.  ?   Appearance: She is well-developed.  ?HENT:  ?   Head: Normocephalic.  ?Eyes:  ?   Conjunctiva/sclera: Conjunctivae normal.  ?Cardiovascular:  ?   Rate and Rhythm: Normal rate and regular rhythm.  ?   Heart sounds: Normal heart sounds.  ?Pulmonary:  ?   Effort: Pulmonary effort is normal.  ?   Breath sounds: Normal breath sounds.  ?Skin: ?   General: Skin is warm.  ?Neurological:  ?   Mental Status: She is alert and oriented to person, place, and time.  ?Psychiatric:     ?   Attention and Perception: Attention normal.     ?   Mood and Affect: Mood normal.     ?   Speech: Speech normal.     ?   Behavior: Behavior is cooperative.  ?  ? ?No results found for any visits on 01/08/22. ? Assessment & Plan  ?  ? ?Problem List Items Addressed This Visit   ? ?  ? Cardiovascular and Mediastinum  ? Hypertension - Primary  ?  Managed with amlodipine 5 mg and atenolol/chlorthalidone 50/25 mg ?Pt reports not taking during the weekends--advised to be more consistent before making changes ?Previously was taking amlodipine 10 mg with better control, but had peripheral edema ? ?  ?  ? Relevant Orders  ? Comprehensive metabolic panel  ?  ? Other  ? Hypertriglyceridemia  ?  Previously, unmedicated.  ?Will recheck lipid panel ? ? ?  ?  ? Relevant Orders  ? Lipid panel  ? History of anemia  ?  Historically borderline anemic, will check cbc and iron panel ? ?  ?  ? Relevant Orders  ? CBC with Differential/Platelet  ? Iron, TIBC and Ferritin Panel  ? Family history of diabetes mellitus (DM)  ? Relevant Orders  ? HgB A1c  ? History of gout  ?  Will check uric acid level ? ?  ?  ? Relevant Orders  ? Uric acid  ? ?Other Visit Diagnoses   ? ? BMI 39.0-39.9,adult      ? Relevant Orders  ? HgB A1c  ? ?  ?  ? ?Return in about 6 months (around  07/11/2022) for chronic conditions.  ?   ? ?I, Mikey Kirschner, PA-C have reviewed all documentation for this visit. The documentation on 01/08/2022 for the exam, diagnosis, procedures, and orders are all accurate and complete. ? ?Mikey Kirschner, PA-C ?[place] ?[id number] ?[place], [place], [address] ?Office: [phone] ?Fax: [phone]  ? ?[facility]  ?

## 2022-01-08 NOTE — Assessment & Plan Note (Signed)
Will check uric acid level 

## 2022-01-08 NOTE — Assessment & Plan Note (Signed)
Historically borderline anemic, will check cbc and iron panel ?

## 2022-01-08 NOTE — Assessment & Plan Note (Addendum)
Managed with amlodipine 5 mg and atenolol/chlorthalidone 50/25 mg ?Pt reports not taking during the weekends--advised to be more consistent before making changes ?Previously was taking amlodipine 10 mg with better control, but had peripheral edema ?

## 2022-01-08 NOTE — Assessment & Plan Note (Addendum)
Previously, unmedicated.  ?Will recheck lipid panel ? ?

## 2022-01-09 ENCOUNTER — Other Ambulatory Visit: Payer: Self-pay | Admitting: Physician Assistant

## 2022-01-09 DIAGNOSIS — M1A0420 Idiopathic chronic gout, left hand, without tophus (tophi): Secondary | ICD-10-CM

## 2022-01-09 DIAGNOSIS — D649 Anemia, unspecified: Secondary | ICD-10-CM

## 2022-01-09 LAB — CBC WITH DIFFERENTIAL/PLATELET
Basophils Absolute: 0.1 10*3/uL (ref 0.0–0.2)
Basos: 1 %
EOS (ABSOLUTE): 0.1 10*3/uL (ref 0.0–0.4)
Eos: 1 %
Hematocrit: 32.8 % — ABNORMAL LOW (ref 34.0–46.6)
Hemoglobin: 11 g/dL — ABNORMAL LOW (ref 11.1–15.9)
Immature Grans (Abs): 0 10*3/uL (ref 0.0–0.1)
Immature Granulocytes: 0 %
Lymphocytes Absolute: 2.4 10*3/uL (ref 0.7–3.1)
Lymphs: 39 %
MCH: 28.9 pg (ref 26.6–33.0)
MCHC: 33.5 g/dL (ref 31.5–35.7)
MCV: 86 fL (ref 79–97)
Monocytes Absolute: 0.6 10*3/uL (ref 0.1–0.9)
Monocytes: 10 %
Neutrophils Absolute: 3 10*3/uL (ref 1.4–7.0)
Neutrophils: 49 %
Platelets: 369 10*3/uL (ref 150–450)
RBC: 3.8 x10E6/uL (ref 3.77–5.28)
RDW: 17.9 % — ABNORMAL HIGH (ref 11.7–15.4)
WBC: 6.2 10*3/uL (ref 3.4–10.8)

## 2022-01-09 LAB — IRON,TIBC AND FERRITIN PANEL
Ferritin: 33 ng/mL (ref 15–150)
Iron Saturation: 65 % — ABNORMAL HIGH (ref 15–55)
Iron: 267 ug/dL (ref 27–159)
Total Iron Binding Capacity: 408 ug/dL (ref 250–450)
UIBC: 141 ug/dL (ref 131–425)

## 2022-01-09 LAB — LIPID PANEL
Chol/HDL Ratio: 2.6 ratio (ref 0.0–4.4)
Cholesterol, Total: 183 mg/dL (ref 100–199)
HDL: 70 mg/dL (ref >39)
LDL Chol Calc (NIH): 72 mg/dL (ref 0–99)
Triglycerides: 256 mg/dL — ABNORMAL HIGH (ref 0–149)
VLDL Cholesterol Cal: 41 mg/dL — ABNORMAL HIGH (ref 5–40)

## 2022-01-09 LAB — COMPREHENSIVE METABOLIC PANEL
ALT: 24 IU/L (ref 0–32)
AST: 33 IU/L (ref 0–40)
Albumin/Globulin Ratio: 1.4 (ref 1.2–2.2)
Albumin: 4.3 g/dL (ref 3.8–4.8)
Alkaline Phosphatase: 71 IU/L (ref 44–121)
BUN/Creatinine Ratio: 5 — ABNORMAL LOW (ref 9–23)
BUN: 4 mg/dL — ABNORMAL LOW (ref 6–24)
Bilirubin Total: 0.7 mg/dL (ref 0.0–1.2)
CO2: 23 mmol/L (ref 20–29)
Calcium: 9.6 mg/dL (ref 8.7–10.2)
Chloride: 99 mmol/L (ref 96–106)
Creatinine, Ser: 0.79 mg/dL (ref 0.57–1.00)
Globulin, Total: 3.1 g/dL (ref 1.5–4.5)
Glucose: 113 mg/dL — ABNORMAL HIGH (ref 70–99)
Potassium: 3.6 mmol/L (ref 3.5–5.2)
Sodium: 138 mmol/L (ref 134–144)
Total Protein: 7.4 g/dL (ref 6.0–8.5)
eGFR: 93 mL/min/{1.73_m2} (ref >59)

## 2022-01-09 LAB — URIC ACID: Uric Acid: 11.6 mg/dL — ABNORMAL HIGH (ref 2.6–6.2)

## 2022-01-09 LAB — HEMOGLOBIN A1C
Est. average glucose Bld gHb Est-mCnc: 111 mg/dL
Hgb A1c MFr Bld: 5.5 % (ref 4.8–5.6)

## 2022-01-09 MED ORDER — ALLOPURINOL 100 MG PO TABS: 100.0000 mg | ORAL_TABLET | Freq: Every day | ORAL | 1 refills | Status: DC

## 2022-01-10 ENCOUNTER — Ambulatory Visit: Payer: BC Managed Care – PPO | Admitting: Family Medicine

## 2022-01-20 ENCOUNTER — Inpatient Hospital Stay: Payer: BC Managed Care – PPO

## 2022-01-20 ENCOUNTER — Inpatient Hospital Stay: Payer: BC Managed Care – PPO | Attending: Internal Medicine | Admitting: Internal Medicine

## 2022-01-20 ENCOUNTER — Encounter: Payer: Self-pay | Admitting: Internal Medicine

## 2022-01-20 VITALS — BP 134/86 | HR 92 | Temp 99.4°F | Resp 16 | Ht 62.0 in | Wt 218.8 lb

## 2022-01-20 DIAGNOSIS — M109 Gout, unspecified: Secondary | ICD-10-CM | POA: Insufficient documentation

## 2022-01-20 DIAGNOSIS — Z9884 Bariatric surgery status: Secondary | ICD-10-CM | POA: Insufficient documentation

## 2022-01-20 DIAGNOSIS — Z9889 Other specified postprocedural states: Secondary | ICD-10-CM | POA: Diagnosis not present

## 2022-01-20 DIAGNOSIS — D573 Sickle-cell trait: Secondary | ICD-10-CM | POA: Diagnosis not present

## 2022-01-20 DIAGNOSIS — Z803 Family history of malignant neoplasm of breast: Secondary | ICD-10-CM | POA: Insufficient documentation

## 2022-01-20 DIAGNOSIS — R7989 Other specified abnormal findings of blood chemistry: Secondary | ICD-10-CM | POA: Insufficient documentation

## 2022-01-20 DIAGNOSIS — F1090 Alcohol use, unspecified, uncomplicated: Secondary | ICD-10-CM | POA: Insufficient documentation

## 2022-01-20 DIAGNOSIS — I10 Essential (primary) hypertension: Secondary | ICD-10-CM | POA: Diagnosis not present

## 2022-01-20 NOTE — Assessment & Plan Note (Addendum)
#  Elevated iron levels/ferritin-suspect acute phase reactant/liver disease rather than primary hemochromatosis.  Hold off any hemochromatosis genotyping at this time. ? 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  Monitor for now ? ?# Sickle cell trait-chronic mild anemia hemoglobin 10-11 clinically stable. ? ?#Excessive alcohol intake: 3-4 beers every night as per patient.  Recommend moderation/slow cessation which is likely the cause of 4 elevated iron studies. ? ?Thank you, Ms. Ok Edwards  for allowing me to participate in the care of your pleasant patient. Please do not hesitate to contact me with questions or concerns in the interim. ? ?# DISPOSITION: ?# No labs today ?# follow up in 6 months- MD; 1 week PRIOR labs- cbc/cmp;LDH; iron studies ferritin--Dr.B ?

## 2022-01-20 NOTE — Progress Notes (Signed)
[facility] ?CONSULT NOTE ? ?Patient Care Team: ?[name], MD as PCP - General (Family Medicine) ? ?CHIEF COMPLAINTS/PURPOSE OF CONSULTATION: ELEVATED IRON LEVELS ? ? ?HEMATOLOGY HISTORY ? ?# Elevated Iron levels:  ? ?# GASTRIC BYPASS [2010; [facility]; metabolic weight loss clinic] ? ? Latest Reference Range & Units 01/08/22 11:02  ?WBC 3.4 - 10.8 x10E3/uL 6.2  ?RBC 3.77 - 5.28 x10E6/uL 3.80  ?Hemoglobin 11.1 - 15.9 g/dL 03.5 (L)  ?HCT 34.0 - 46.6 % 32.8 (L)  ?MCV 79 - 97 fL 86  ?MCH 26.6 - 33.0 pg 28.9  ?MCHC 31.5 - 35.7 g/dL 46.5  ?RDW 11.7 - 15.4 % 17.9 (H)  ?Platelets 150 - 450 x10E3/uL 369  ?(L): Data is abnormally low ?(H): Data is abnormally high ? ? Latest Reference Range & Units Most Recent 01/08/22 11:02  ?Iron 27 - 159 ug/dL 681 (HH) ?10/15/49 70:01 267 (HH)  ?UIBC 131 - 425 ug/dL 749 ?12/11/94 75:91 638  ?TIBC 250 - 450 ug/dL 466 ?01/15/92 57:01 779  ?Ferritin 15 - 150 ng/mL 33 ?01/08/22 11:02 33  ?Iron Saturation 15 - 55 % 65 (H) ?01/08/22 11:02 65 (H)  ?[facility]): Data is critically high ?(H): Data is abnormally high ? ?HISTORY OF PRESENTING ILLNESS: Alone.  Ambulating independently. ? ?Kara Murray 46 y.o.  female pleasant patient was been referred to [place] for further evaluation of elevated Iron levels. ? ?Patient admits to history of chronic anemia-sickle cell trait as per patient.  Patient however has not received any blood transfusions.  She has not had any hospitalizations. ? ?Patient denies any over-the-counter iron medications.  ?  ?Review of Systems  ?Constitutional:  Negative for chills, diaphoresis, fever, malaise/fatigue and weight loss.  ?HENT:  Negative for nosebleeds and sore throat.   ?Eyes:  Negative for double vision.  ?Respiratory:  Negative for cough, hemoptysis, sputum production, shortness of breath and wheezing.   ?Cardiovascular:  Negative for chest pain, palpitations, orthopnea and leg swelling.  ?Gastrointestinal:  Negative for abdominal pain, blood in stool,  constipation, diarrhea, heartburn, melena, nausea and vomiting.  ?Genitourinary:  Negative for dysuria, frequency and urgency.  ?Musculoskeletal:  Positive for joint pain.  ?Skin: Negative.  Negative for itching and rash.  ?Neurological:  Negative for dizziness, tingling, focal weakness, weakness and headaches.  ?Endo/Heme/Allergies:  Does not bruise/bleed easily.  ?Psychiatric/Behavioral:  Negative for depression. The patient is not nervous/anxious and does not have insomnia.   ? ? ?MEDICAL HISTORY:  ?Past Medical History:  ?Diagnosis Date  ? Gout   ? Hypertension   ? ? ?SURGICAL HISTORY: ?Past Surgical History:  ?Procedure Laterality Date  ? biariactric surgery  2010  ? KNEE SURGERY  1994, 2001  ? THUMB FUSION Left 04/11/14  ? TOE SURGERY  2008  ? ? ?SOCIAL HISTORY: ?Social History  ? ?Socioeconomic History  ? Marital status: Married  ?  Spouse name: Not on file  ? Number of children: Not on file  ? Years of education: Not on file  ? Highest education level: Not on file  ?Occupational History  ? Not on file  ?Tobacco Use  ? Smoking status: Never  ? Smokeless tobacco: Never  ?Vaping Use  ? Vaping Use: Never used  ?Substance and Sexual Activity  ? Alcohol use: Yes  ?  Alcohol/week: 0.0 standard drinks  ?  Comment: socially   ? Drug use: Never  ? Sexual activity: Yes  ?  Birth control/protection: I.U.D.  ?  Comment: Mirena   ?  Other Topics Concern  ? Not on file  ?Social History Narrative  ? Not on file  ? ?Social Determinants of Health  ? ?Financial Resource Strain: Not on file  ?Food Insecurity: Not on file  ?Transportation Needs: Not on file  ?Physical Activity: Not on file  ?Stress: Not on file  ?Social Connections: Not on file  ?Intimate Partner Violence: Not on file  ? ? ?FAMILY HISTORY: ?Family History  ?Problem Relation Age of Onset  ? Hypertension Mother   ? Diabetes Mother   ? Heart attack Father   ? Hypertension Brother   ? Breast cancer Maternal Aunt 50  ? Breast cancer Cousin   ? ? ?ALLERGIES:  has No  Known Allergies. ? ?MEDICATIONS:  ?Current Outpatient Medications  ?Medication Sig Dispense Refill  ? allopurinol (ZYLOPRIM) 100 MG tablet Take 1 tablet (100 mg total) by mouth daily. 90 tablet 1  ? amLODipine (NORVASC) 5 MG tablet Take 1 tablet (5 mg total) by mouth daily. 90 tablet 3  ? atenolol-chlorthalidone (TENORETIC) 50-25 MG tablet Take 1 tablet by mouth daily. 90 tablet 3  ? levonorgestrel (MIRENA) 20 MCG/24HR IUD 1 Intra Uterine Device (1 each total) by Intrauterine route once for 1 dose. 1 each 0  ? ?No current facility-administered medications for this visit.  ? ?  ?. ? ?PHYSICAL EXAMINATION: ? ? ?Vitals:  ? 01/20/22 1100  ?BP: 134/86  ?Pulse: 92  ?Resp: 16  ?Temp: 99.4 ?F (37.4 ?C)  ? ?Filed Weights  ? 01/20/22 1100  ?Weight: 218 lb 12.8 oz (99.2 kg)  ? ? ?Physical Exam ?Vitals and nursing note reviewed.  ?HENT:  ?   Head: Normocephalic and atraumatic.  ?   Mouth/Throat:  ?   Pharynx: Oropharynx is clear.  ?Eyes:  ?   Extraocular Movements: Extraocular movements intact.  ?   Pupils: Pupils are equal, round, and reactive to light.  ?Cardiovascular:  ?   Rate and Rhythm: Normal rate and regular rhythm.  ?Pulmonary:  ?   Comments: Decreased breath sounds bilaterally.  ?Abdominal:  ?   Palpations: Abdomen is soft.  ?Musculoskeletal:     ?   General: Normal range of motion.  ?   Cervical back: Normal range of motion.  ?Skin: ?   General: Skin is warm.  ?Neurological:  ?   General: No focal deficit present.  ?   Mental Status: She is alert and oriented to person, place, and time.  ?Psychiatric:     ?   Behavior: Behavior normal.     ?   Judgment: Judgment normal.  ?  ? ?LABORATORY DATA:  ?I have reviewed the data as listed ?Lab Results  ?Component Value Date  ? WBC 6.2 01/08/2022  ? HGB 11.0 (L) 01/08/2022  ? HCT 32.8 (L) 01/08/2022  ? MCV 86 01/08/2022  ? PLT 369 01/08/2022  ? ?Recent Labs  ?  01/08/22 ?1102  ?NA 138  ?K 3.6  ?CL 99  ?CO2 23  ?GLUCOSE 113*  ?BUN 4*  ?CREATININE 0.79  ?CALCIUM 9.6  ?PROT 7.4   ?ALBUMIN 4.3  ?AST 33  ?ALT 24  ?ALKPHOS 71  ?BILITOT 0.7  ? ? ? ?No results found. ? ?ASSESSMENT & PLAN:  ? ?Elevated ferritin ?#Elevated iron levels/ferritin-suspect acute phase reactant/liver disease rather than primary hemochromatosis.  Hold off any hemochromatosis genotyping at this time. ? 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  Monitor for now ? ?# Sickle cell trait-chronic mild anemia hemoglobin 10-11 clinically stable. ? ?#  Excessive alcohol intake: 3-4 beers every night as per patient.  Recommend moderation/slow cessation which is likely the cause of 4 elevated iron studies. ? ?Thank you, Ms. Ok EdwardsDrubel  for allowing me to participate in the care of your pleasant patient. Please do not hesitate to contact me with questions or concerns in the interim. ? ?# DISPOSITION: ?# No labs today ?# follow up in 6 months- MD; 1 week PRIOR labs- cbc/cmp;LDH; iron studies ferritin--Dr.B ? ? ? ?All questions were answered. The patient knows to call the clinic with any problems, questions or concerns. ? ? ? Earna CoderGovinda R Rhyse Loux, MD ?01/20/2022 6:35 PM ? ?

## 2022-02-13 ENCOUNTER — Telehealth: Payer: Self-pay | Admitting: Family Medicine

## 2022-02-13 DIAGNOSIS — I10 Essential (primary) hypertension: Secondary | ICD-10-CM

## 2022-02-13 MED ORDER — AMLODIPINE BESYLATE 5 MG PO TABS: 5.0000 mg | ORAL_TABLET | Freq: Every day | ORAL | 3 refills | Status: DC

## 2022-02-13 NOTE — Telephone Encounter (Signed)
Refilled medication

## 2022-02-13 NOTE — Telephone Encounter (Signed)
[facility] faxed refill request for the following medications:   amLODipine (NORVASC) 5 MG tablet   Please advise.  

## 2022-04-16 ENCOUNTER — Encounter (INDEPENDENT_AMBULATORY_CARE_PROVIDER_SITE_OTHER): Payer: Self-pay

## 2022-06-17 ENCOUNTER — Emergency Department
Admission: EM | Admit: 2022-06-17 | Discharge: 2022-06-17 | Disposition: A | Discharge status: Home / Self Care | Payer: BC Managed Care – PPO | Attending: Emergency Medicine | Admitting: Emergency Medicine

## 2022-06-17 ENCOUNTER — Other Ambulatory Visit: Payer: Self-pay

## 2022-06-17 DIAGNOSIS — Z79899 Other long term (current) drug therapy: Secondary | ICD-10-CM | POA: Diagnosis not present

## 2022-06-17 DIAGNOSIS — I10 Essential (primary) hypertension: Secondary | ICD-10-CM | POA: Diagnosis not present

## 2022-06-17 DIAGNOSIS — H6993 Unspecified Eustachian tube disorder, bilateral: Secondary | ICD-10-CM | POA: Diagnosis not present

## 2022-06-17 DIAGNOSIS — J0110 Acute frontal sinusitis, unspecified: Secondary | ICD-10-CM | POA: Diagnosis not present

## 2022-06-17 DIAGNOSIS — R0981 Nasal congestion: Secondary | ICD-10-CM | POA: Diagnosis present

## 2022-06-17 DIAGNOSIS — Z1152 Encounter for screening for COVID-19: Secondary | ICD-10-CM | POA: Insufficient documentation

## 2022-06-17 LAB — GROUP A STREP BY PCR: Group A Strep by PCR: NOT DETECTED

## 2022-06-17 LAB — SARS CORONAVIRUS 2 BY RT PCR: SARS Coronavirus 2 by RT PCR: NEGATIVE

## 2022-06-17 MED ORDER — PREDNISONE 20 MG PO TABS
30.0000 mg | ORAL_TABLET | Freq: Once | ORAL | Status: AC
  Administered 2022-06-17: 30 mg via ORAL
  Filled 2022-06-17: qty 1

## 2022-06-17 MED ORDER — METHYLPREDNISOLONE 4 MG PO TBPK: ORAL_TABLET | ORAL | 0 refills | Status: DC

## 2022-06-17 MED ORDER — AMOXICILLIN-POT CLAVULANATE 875-125 MG PO TABS
1.0000 | ORAL_TABLET | Freq: Once | ORAL | Status: AC
  Administered 2022-06-17: 1 via ORAL
  Filled 2022-06-17: qty 1

## 2022-06-17 MED ORDER — AMOXICILLIN-POT CLAVULANATE 875-125 MG PO TABS: 1.0000 | ORAL_TABLET | Freq: Two times a day (BID) | ORAL | 0 refills | Status: DC

## 2022-06-17 NOTE — Discharge Instructions (Addendum)
1.  Take antibiotics as prescribed (Augmentin 875 mg twice daily x7 days). 2.  Take steroid taper as prescribed. 3.  You may find the results of your COVID and strep swabs via MyChart. 4.  Return to the ER for worsening symptoms, persistent vomiting, difficulty breathing or other concerns.

## 2022-06-17 NOTE — ED Provider Notes (Signed)
[facility] Provider Note    Event Date/Time   First MD Initiated Contact with Patient 06/17/22 [phone]     (approximate)   History   Facial Pain   HPI  Kara Murray is a 46 y.o. female complaint of who presents to the ED from home with a chief sinus pressure and eyes and ears, dry cough, nasal congestion and bilateral ear pain.  Symptoms x2 days but pressure and head started today.  Patient does work at a school.  Denies fever, chills, chest pain, shortness of breath, abdominal pain, nausea, vomiting or dizziness.     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 Authorizing Provider  allopurinol (ZYLOPRIM) 100 MG tablet Take 1 tablet (100 mg total) by mouth daily. 01/09/22   Mikey Kirschner, PA-C  amLODipine (NORVASC) 5 MG tablet Take 1 tablet (5 mg total) by mouth daily. 02/13/22   Birdie Sons, MD  atenolol-chlorthalidone (TENORETIC) 50-25 MG tablet Take 1 tablet by mouth daily. 01/08/22   Mikey Kirschner, PA-C  levonorgestrel (MIRENA) 20 MCG/24HR IUD 1 Intra Uterine Device (1 each total) by Intrauterine route once for 1 dose. 01/06/18 1/60/73  Copland, Deirdre Evener, PA-C      Allergies  Patient has no known allergies.   Family History   Family History  Problem Relation Age of Onset   Hypertension Mother    Diabetes Mother    Heart attack Father    Hypertension Brother    Breast cancer Maternal Aunt 50   Breast cancer Cousin      Physical Exam  Triage Vital Signs: ED Triage Vitals  Enc Vitals Group     BP 06/17/22 0224 (!) 149/94     Pulse Rate 06/17/22 0224 79     Resp 06/17/22 0224 18     Temp 06/17/22 0224 98.2 F (36.8 C)     Temp Source 06/17/22 0224 Oral     SpO2 06/17/22 0224 100 %     Weight --      Height --      Head Circumference --      Peak Flow --      Pain Score 06/17/22 0218 4     Pain Loc --      Pain Edu? --      Excl. in [place]? --     Updated Vital Signs: BP (!) 149/94   Pulse 79   Temp 98.2 F (36.8 C) (Oral)   Resp 18   SpO2 100%    General: Awake, no distress.  CV:  RRR.  Good peripheral perfusion.  Resp:  Normal effort.  CTA B. Abd:  No distention.  Other:  Bilateral TMs not erythematous with fluid behind them without perforation.  Frontal sinus tender to palpation.  Postnasal drip noted in posterior oropharynx, otherwise unremarkable.  No lymphadenopathy.  Supple neck without meningismus.   ED Results / Procedures / Treatments  Labs (all labs ordered are listed, but only abnormal results are displayed) Labs Reviewed  SARS CORONAVIRUS 2 BY RT PCR  GROUP A STREP BY PCR     EKG  None   RADIOLOGY None   Official radiology report(s): No results found.   PROCEDURES:  Critical Care performed: No  Procedures   MEDICATIONS ORDERED IN ED: Medications  predniSONE (DELTASONE) tablet 30 mg (has no administration in time range)  amoxicillin-clavulanate (AUGMENTIN) 875-125 MG per tablet 1 tablet (has no administration in time range)     IMPRESSION / MDM / ASSESSMENT AND PLAN / ED COURSE  I reviewed the triage vital signs and the nursing notes.                             46 year old  female presenting with sinus pressure and eustachian tube dysfunction.  Will start Medrol Dosepak and Augmentin.  Patient will review the results of her COVID and strep swab via MyChart.  Strict return precautions given.  Patient verbalizes understanding and agrees with plan of care.  Patient's presentation is most consistent with acute, uncomplicated illness.   FINAL CLINICAL IMPRESSION(S) / ED DIAGNOSES   Final diagnoses:  Acute frontal sinusitis, recurrence not specified  Dysfunction of both eustachian tubes     Rx / DC Orders   ED Discharge Orders     None        Note:  This document was prepared using Dragon voice recognition software and may include unintentional dictation errors.   Irean Hong, MD 06/17/22 [phone]

## 2022-06-17 NOTE — ED Triage Notes (Signed)
Pt presents to ED via POV c/o pressure in eye and ears. Pt states this started a few days ago but got worse today. Pt states this has happened before with sinus infections but not this bad. Pt also stating shes had a sore throat/cough for a few days.

## 2022-07-09 ENCOUNTER — Other Ambulatory Visit: Payer: Self-pay

## 2022-07-09 ENCOUNTER — Encounter: Payer: Self-pay | Admitting: Emergency Medicine

## 2022-07-09 ENCOUNTER — Observation Stay
Admission: EM | Admit: 2022-07-09 | Discharge: 2022-07-10 | Disposition: A | Discharge status: Home / Self Care | Payer: BC Managed Care – PPO | Attending: Osteopathic Medicine | Admitting: Osteopathic Medicine

## 2022-07-09 DIAGNOSIS — E871 Hypo-osmolality and hyponatremia: Secondary | ICD-10-CM | POA: Diagnosis present

## 2022-07-09 DIAGNOSIS — I10 Essential (primary) hypertension: Secondary | ICD-10-CM | POA: Diagnosis not present

## 2022-07-09 DIAGNOSIS — Z79899 Other long term (current) drug therapy: Secondary | ICD-10-CM | POA: Insufficient documentation

## 2022-07-09 DIAGNOSIS — K529 Noninfective gastroenteritis and colitis, unspecified: Secondary | ICD-10-CM | POA: Diagnosis present

## 2022-07-09 DIAGNOSIS — Z8739 Personal history of other diseases of the musculoskeletal system and connective tissue: Secondary | ICD-10-CM | POA: Diagnosis not present

## 2022-07-09 DIAGNOSIS — D573 Sickle-cell trait: Secondary | ICD-10-CM | POA: Diagnosis present

## 2022-07-09 DIAGNOSIS — E876 Hypokalemia: Secondary | ICD-10-CM | POA: Diagnosis not present

## 2022-07-09 DIAGNOSIS — E878 Other disorders of electrolyte and fluid balance, not elsewhere classified: Principal | ICD-10-CM | POA: Diagnosis present

## 2022-07-09 DIAGNOSIS — R7989 Other specified abnormal findings of blood chemistry: Secondary | ICD-10-CM | POA: Diagnosis present

## 2022-07-09 DIAGNOSIS — F1010 Alcohol abuse, uncomplicated: Secondary | ICD-10-CM | POA: Diagnosis not present

## 2022-07-09 DIAGNOSIS — R945 Abnormal results of liver function studies: Secondary | ICD-10-CM | POA: Insufficient documentation

## 2022-07-09 DIAGNOSIS — G4700 Insomnia, unspecified: Secondary | ICD-10-CM | POA: Diagnosis present

## 2022-07-09 LAB — PHOSPHORUS: Phosphorus: 2.8 mg/dL (ref 2.5–4.6)

## 2022-07-09 LAB — COMPREHENSIVE METABOLIC PANEL
ALT: 46 U/L — ABNORMAL HIGH (ref 0–44)
AST: 70 U/L — ABNORMAL HIGH (ref 15–41)
Albumin: 4 g/dL (ref 3.5–5.0)
Alkaline Phosphatase: 90 U/L (ref 38–126)
Anion gap: 11 (ref 5–15)
BUN: <5 mg/dL — ABNORMAL LOW (ref 6–20)
CO2: 23 mmol/L (ref 22–32)
Calcium: 8.3 mg/dL — ABNORMAL LOW (ref 8.9–10.3)
Chloride: 97 mmol/L — ABNORMAL LOW (ref 98–111)
Creatinine, Ser: 0.68 mg/dL (ref 0.44–1.00)
GFR, Estimated: >60 mL/min (ref >60)
Glucose, Bld: 143 mg/dL — ABNORMAL HIGH (ref 70–99)
Potassium: 3.2 mmol/L — ABNORMAL LOW (ref 3.5–5.1)
Sodium: 131 mmol/L — ABNORMAL LOW (ref 135–145)
Total Bilirubin: 2.1 mg/dL — ABNORMAL HIGH (ref 0.3–1.2)
Total Protein: 7.5 g/dL (ref 6.5–8.1)

## 2022-07-09 LAB — MAGNESIUM: Magnesium: 1.8 mg/dL (ref 1.7–2.4)

## 2022-07-09 LAB — URINE DRUG SCREEN, QUALITATIVE (ARMC ONLY)
Amphetamines, Ur Screen: NOT DETECTED
Barbiturates, Ur Screen: NOT DETECTED
Benzodiazepine, Ur Scrn: NOT DETECTED
Cannabinoid 50 Ng, Ur ~~LOC~~: NOT DETECTED
Cocaine Metabolite,Ur ~~LOC~~: NOT DETECTED
MDMA (Ecstasy)Ur Screen: NOT DETECTED
Methadone Scn, Ur: NOT DETECTED
Opiate, Ur Screen: NOT DETECTED
Phencyclidine (PCP) Ur S: NOT DETECTED
Tricyclic, Ur Screen: NOT DETECTED

## 2022-07-09 LAB — BASIC METABOLIC PANEL
Anion gap: 13 (ref 5–15)
Anion gap: 10 (ref 5–15)
BUN: <5 mg/dL — ABNORMAL LOW (ref 6–20)
BUN: <5 mg/dL — ABNORMAL LOW (ref 6–20)
CO2: 23 mmol/L (ref 22–32)
CO2: 23 mmol/L (ref 22–32)
Calcium: 8.6 mg/dL — ABNORMAL LOW (ref 8.9–10.3)
Calcium: 8.6 mg/dL — ABNORMAL LOW (ref 8.9–10.3)
Chloride: 89 mmol/L — ABNORMAL LOW (ref 98–111)
Chloride: 100 mmol/L (ref 98–111)
Creatinine, Ser: 0.69 mg/dL (ref 0.44–1.00)
Creatinine, Ser: 0.69 mg/dL (ref 0.44–1.00)
GFR, Estimated: >60 mL/min (ref >60)
GFR, Estimated: >60 mL/min (ref >60)
Glucose, Bld: 118 mg/dL — ABNORMAL HIGH (ref 70–99)
Glucose, Bld: 126 mg/dL — ABNORMAL HIGH (ref 70–99)
Potassium: 2.3 mmol/L — CL (ref 3.5–5.1)
Potassium: 3.2 mmol/L — ABNORMAL LOW (ref 3.5–5.1)
Sodium: 125 mmol/L — ABNORMAL LOW (ref 135–145)
Sodium: 133 mmol/L — ABNORMAL LOW (ref 135–145)

## 2022-07-09 LAB — CBC WITH DIFFERENTIAL/PLATELET
Abs Immature Granulocytes: 0.05 10*3/uL (ref 0.00–0.07)
Basophils Absolute: 0.1 10*3/uL (ref 0.0–0.1)
Basophils Relative: 1 %
Eosinophils Absolute: 0.1 10*3/uL (ref 0.0–0.5)
Eosinophils Relative: 2 %
HCT: 29.5 % — ABNORMAL LOW (ref 36.0–46.0)
Hemoglobin: 10.4 g/dL — ABNORMAL LOW (ref 12.0–15.0)
Immature Granulocytes: 1 %
Lymphocytes Relative: 39 %
Lymphs Abs: 2.4 10*3/uL (ref 0.7–4.0)
MCH: 29.8 pg (ref 26.0–34.0)
MCHC: 35.3 g/dL (ref 30.0–36.0)
MCV: 84.5 fL (ref 80.0–100.0)
Monocytes Absolute: 0.8 10*3/uL (ref 0.1–1.0)
Monocytes Relative: 13 %
Neutro Abs: 2.9 10*3/uL (ref 1.7–7.7)
Neutrophils Relative %: 44 %
Platelets: 167 10*3/uL (ref 150–400)
RBC: 3.49 MIL/uL — ABNORMAL LOW (ref 3.87–5.11)
RDW: 18.7 % — ABNORMAL HIGH (ref 11.5–15.5)
WBC: 6.3 10*3/uL (ref 4.0–10.5)
nRBC: 0.5 % — ABNORMAL HIGH (ref 0.0–0.2)

## 2022-07-09 LAB — SODIUM, URINE, RANDOM: Sodium, Ur: 35 mmol/L

## 2022-07-09 LAB — ETHANOL: Alcohol, Ethyl (B): <10 mg/dL (ref <10)

## 2022-07-09 LAB — ACETAMINOPHEN LEVEL: Acetaminophen (Tylenol), Serum: <10 ug/mL — ABNORMAL LOW (ref 10–30)

## 2022-07-09 LAB — OSMOLALITY: Osmolality: 278 mOsm/kg (ref 275–295)

## 2022-07-09 LAB — PREGNANCY, URINE: Preg Test, Ur: NEGATIVE

## 2022-07-09 LAB — OSMOLALITY, URINE: Osmolality, Ur: 140 mOsm/kg — ABNORMAL LOW (ref 300–900)

## 2022-07-09 MED ORDER — LORAZEPAM 2 MG/ML IJ SOLN: 1.0000 mg | INTRAMUSCULAR | Status: DC | PRN

## 2022-07-09 MED ORDER — POTASSIUM CHLORIDE CRYS ER 20 MEQ PO TBCR
40.0000 meq | EXTENDED_RELEASE_TABLET | Freq: Once | ORAL | Status: AC
  Administered 2022-07-09: 40 meq via ORAL
  Filled 2022-07-09: qty 2

## 2022-07-09 MED ORDER — COLCHICINE 0.6 MG PO TABS: 0.6000 mg | ORAL_TABLET | Freq: Every day | ORAL | Status: DC | PRN

## 2022-07-09 MED ORDER — MAGNESIUM SULFATE 2 GM/50ML IV SOLN
2.0000 g | Freq: Once | INTRAVENOUS | Status: AC
  Administered 2022-07-09: 2 g via INTRAVENOUS
  Filled 2022-07-09: qty 50

## 2022-07-09 MED ORDER — OXYCODONE HCL 5 MG PO TABS
5.0000 mg | ORAL_TABLET | Freq: Three times a day (TID) | ORAL | Status: DC | PRN
  Filled 2022-07-09: qty 1

## 2022-07-09 MED ORDER — OXYCODONE HCL 5 MG PO TABS: 5.0000 mg | ORAL_TABLET | Freq: Three times a day (TID) | ORAL | Status: DC | PRN

## 2022-07-09 MED ORDER — POTASSIUM CHLORIDE 10 MEQ/100ML IV SOLN
10.0000 meq | Freq: Once | INTRAVENOUS | Status: AC
  Administered 2022-07-09: 10 meq via INTRAVENOUS
  Filled 2022-07-09: qty 100

## 2022-07-09 MED ORDER — LORAZEPAM 1 MG PO TABS: 1.0000 mg | ORAL_TABLET | ORAL | Status: DC | PRN

## 2022-07-09 MED ORDER — POTASSIUM CHLORIDE 10 MEQ/100ML IV SOLN
10.0000 meq | INTRAVENOUS | Status: AC
  Administered 2022-07-09 (×2): 10 meq via INTRAVENOUS
  Filled 2022-07-09 (×2): qty 100

## 2022-07-09 MED ORDER — ADULT MULTIVITAMIN W/MINERALS CH
1.0000 | ORAL_TABLET | Freq: Every day | ORAL | Status: DC
  Administered 2022-07-09 – 2022-07-10 (×2): 1 via ORAL
  Filled 2022-07-09 (×2): qty 1

## 2022-07-09 MED ORDER — THIAMINE HCL 100 MG/ML IJ SOLN
100.0000 mg | Freq: Once | INTRAMUSCULAR | Status: DC
  Filled 2022-07-09: qty 2

## 2022-07-09 MED ORDER — METHOCARBAMOL 500 MG PO TABS: 500.0000 mg | ORAL_TABLET | Freq: Three times a day (TID) | ORAL | Status: DC | PRN

## 2022-07-09 MED ORDER — THIAMINE MONONITRATE 100 MG PO TABS
100.0000 mg | ORAL_TABLET | Freq: Every day | ORAL | Status: DC
  Administered 2022-07-10: 100 mg via ORAL
  Filled 2022-07-09: qty 1

## 2022-07-09 MED ORDER — ENOXAPARIN SODIUM 40 MG/0.4ML IJ SOSY: 40.0000 mg | PREFILLED_SYRINGE | INTRAMUSCULAR | Status: DC

## 2022-07-09 MED ORDER — THIAMINE HCL 100 MG/ML IJ SOLN
100.0000 mg | Freq: Every day | INTRAMUSCULAR | Status: DC
  Administered 2022-07-09: 100 mg via INTRAVENOUS

## 2022-07-09 MED ORDER — FOLIC ACID 1 MG PO TABS
1.0000 mg | ORAL_TABLET | Freq: Every day | ORAL | Status: DC
  Administered 2022-07-09 – 2022-07-10 (×2): 1 mg via ORAL
  Filled 2022-07-09: qty 1

## 2022-07-09 MED ORDER — HYDRALAZINE HCL 20 MG/ML IJ SOLN: 5.0000 mg | INTRAMUSCULAR | Status: DC | PRN

## 2022-07-09 MED ORDER — IBUPROFEN 400 MG PO TABS: 200.0000 mg | ORAL_TABLET | Freq: Four times a day (QID) | ORAL | Status: DC | PRN

## 2022-07-09 MED ORDER — POTASSIUM CHLORIDE CRYS ER 20 MEQ PO TBCR
40.0000 meq | EXTENDED_RELEASE_TABLET | ORAL | Status: AC
  Administered 2022-07-09 (×2): 40 meq via ORAL
  Filled 2022-07-09 (×2): qty 2

## 2022-07-09 MED ORDER — LORAZEPAM 2 MG/ML IJ SOLN: 0.0000 mg | Freq: Four times a day (QID) | INTRAMUSCULAR | Status: DC

## 2022-07-09 MED ORDER — ALLOPURINOL 100 MG PO TABS
100.0000 mg | ORAL_TABLET | Freq: Every day | ORAL | Status: DC
  Administered 2022-07-10: 100 mg via ORAL
  Filled 2022-07-09: qty 1

## 2022-07-09 MED ORDER — DEXTROSE 5 % IV SOLN: INTRAVENOUS | Status: DC

## 2022-07-09 MED ORDER — LORAZEPAM 2 MG/ML IJ SOLN: 0.0000 mg | Freq: Two times a day (BID) | INTRAMUSCULAR | Status: DC

## 2022-07-09 MED ORDER — ATENOLOL 25 MG PO TABS
50.0000 mg | ORAL_TABLET | Freq: Every day | ORAL | Status: DC
  Administered 2022-07-10: 50 mg via ORAL
  Filled 2022-07-09: qty 2

## 2022-07-09 MED ORDER — SODIUM CHLORIDE 0.9 % IV SOLN: INTRAVENOUS | Status: DC

## 2022-07-09 MED ORDER — ACETAMINOPHEN 325 MG PO TABS: 650.0000 mg | ORAL_TABLET | Freq: Four times a day (QID) | ORAL | Status: DC | PRN

## 2022-07-09 MED ORDER — FOLIC ACID 1 MG PO TABS
1.0000 mg | ORAL_TABLET | Freq: Once | ORAL | Status: DC
  Filled 2022-07-09: qty 1

## 2022-07-09 MED ORDER — LOPERAMIDE HCL 2 MG PO CAPS: 2.0000 mg | ORAL_CAPSULE | Freq: Two times a day (BID) | ORAL | Status: DC | PRN

## 2022-07-09 MED ORDER — ENOXAPARIN SODIUM 60 MG/0.6ML IJ SOSY
0.5000 mg/kg | PREFILLED_SYRINGE | INTRAMUSCULAR | Status: DC
  Administered 2022-07-09 – 2022-07-10 (×2): 50 mg via SUBCUTANEOUS
  Filled 2022-07-09 (×2): qty 0.6

## 2022-07-09 MED ORDER — ZOLPIDEM TARTRATE 5 MG PO TABS
5.0000 mg | ORAL_TABLET | Freq: Every evening | ORAL | Status: DC | PRN
  Administered 2022-07-09: 5 mg via ORAL
  Filled 2022-07-09: qty 1

## 2022-07-09 MED ORDER — POTASSIUM CHLORIDE CRYS ER 20 MEQ PO TBCR
40.0000 meq | EXTENDED_RELEASE_TABLET | ORAL | Status: DC
  Administered 2022-07-09: 40 meq via ORAL
  Filled 2022-07-09: qty 2

## 2022-07-09 MED ORDER — SODIUM CHLORIDE 0.9 % IV BOLUS
1000.0000 mL | Freq: Once | INTRAVENOUS | Status: AC
  Administered 2022-07-09: 1000 mL via INTRAVENOUS

## 2022-07-09 MED ORDER — AMLODIPINE BESYLATE 5 MG PO TABS
5.0000 mg | ORAL_TABLET | Freq: Every day | ORAL | Status: DC
  Administered 2022-07-10: 5 mg via ORAL
  Filled 2022-07-09: qty 1

## 2022-07-09 MED ORDER — ONDANSETRON HCL 4 MG/2ML IJ SOLN: 4.0000 mg | Freq: Three times a day (TID) | INTRAMUSCULAR | Status: DC | PRN

## 2022-07-09 NOTE — ED Notes (Signed)
Informed RN bed assigned 

## 2022-07-09 NOTE — H&P (Addendum)
History and Physical    Kara Murray GYI:[id number] DOB: 01/02/1976 DOA: 07/09/2022  Referring MD/NP/PA:   PCP: [name], MD   Patient coming from:  The patient is coming from home.  At baseline, pt is independent for most of ADL.        Chief Complaint: Insomnia  HPI: Kara Murray is a 46 y.o. female with medical history significant of hypertension, gout, sickle cell trait, alcohol abuse, chronic diarrhea, s/p s/p of bariatric surgery, who presents with insomnia.  Pt has been drinking beers heavily at the daily basis.  He states that she has not been feeling good in the past several days.  She reports insomnia, difficulty sleeping, muscle cramps in both legs and tingling in both legs.  She has poor appetite and decreased oral intake. Patient states that she has chronic intermittent diarrhea ever since she had gastric bypass procedure.  Her diarrhea pattern has not changed.  Patient does not have nausea vomiting or abdominal pain.  No symptoms of UTI.  Patient denies chest pain, cough, shortness of breath.  No fever or chills. She sometimes smokes marijuana but denies any other drug use. She has tried taking Tylenol at home but it does not seem to help.  Patient is alert oriented x3.  No confusion during interview.   Data reviewed independently and ED Course: pt was found to have WBC 6.3, potassium 2.3, sodium 125, GFR> 60, magnesium 1.8, alcohol level less than 10, temperature normal, blood pressure 131/87, heart rate 96, RR 20, oxygen saturation 100% on room air.  Patient is placed on telemetry bed for patient.   EKG: Reviewed independently, sinus rhythm, QTc 477, possible left atrial enlargement, early R wave progression.   Review of Systems:   General: no fevers, chills, no body weight gain, has poor appetite, has fatigue HEENT: no blurry vision, hearing changes or sore throat Respiratory: no dyspnea, coughing, wheezing CV: no chest pain, no  palpitations GI: no nausea, vomiting, abdominal pain, has chronic diarrhea, no constipation GU: no dysuria, burning on urination, increased urinary frequency, hematuria  Ext: no leg edema Neuro: no unilateral weakness.  no vision change or hearing loss.  Has tingling in both legs.  Has insomnia. Skin: no rash, no skin tear. MSK: No muscle spasm, no deformity, no limitation of range of movement in spin. Has leg cramps. Heme: No easy bruising.  Travel history: No recent long distant travel.   Allergy: No Known Allergies  Past Medical History:  Diagnosis Date   Gout    Hypertension     Past Surgical History:  Procedure Laterality Date   biariactric surgery  2010   KNEE SURGERY  1994, 2001   THUMB FUSION Left 04/11/14   TOE SURGERY  2008    Social History:  reports that she has never smoked. She has never used smokeless tobacco. She reports current alcohol use. She reports that she does not use drugs.  Family History:  Family History  Problem Relation Age of Onset   Hypertension Mother    Diabetes Mother    Heart attack Father    Hypertension Brother    Breast cancer Maternal Aunt 74   Breast cancer Cousin      Prior to Admission medications   Medication Sig Start Date End Date Taking? Authorizing Provider  allopurinol (ZYLOPRIM) 100 MG tablet Take 1 tablet (100 mg total) by mouth daily. 01/09/22   Alfredia Ferguson, PA-C  amLODipine (NORVASC) 5 MG tablet Take 1 tablet (  5 mg total) by mouth daily. 02/13/22   [name], MD  amoxicillin-clavulanate (AUGMENTIN) 875-125 MG tablet Take 1 tablet by mouth 2 (two) times daily. 06/17/22   Irean Hong, MD  atenolol-chlorthalidone (TENORETIC) 50-25 MG tablet Take 1 tablet by mouth daily. 01/08/22   Alfredia Ferguson, PA-C  levonorgestrel (MIRENA) 20 MCG/24HR IUD 1 Intra Uterine Device (1 each total) by Intrauterine route once for 1 dose. 01/06/18 01/20/22  Copland, Ilona Sorrel, PA-C  methylPREDNISolone (MEDROL DOSEPAK) 4 MG TBPK tablet Take  as directed 06/17/22   Irean Hong, MD    Physical Exam: Vitals:   07/09/22 0540 07/09/22 0600 07/09/22 0730 07/09/22 1100  BP: (!) 141/95 131/87 113/66 124/77  Pulse: 96 86 93 91  Resp: 20  16 13   Temp: 97.6 F (36.4 C)   98 F (36.7 C)  TempSrc: Oral   Oral  SpO2: 100% 100% 100% 100%  Weight:      Height:       General: Not in acute distress HEENT:       Eyes: PERRL, EOMI, no scleral icterus.       ENT: No discharge from the ears and nose, no pharynx injection, no tonsillar enlargement.        Neck: No JVD, no bruit, no mass felt. Heme: No neck lymph node enlargement. Cardiac: S1/S2, RRR, No murmurs, No gallops or rubs. Respiratory: No rales, wheezing, rhonchi or rubs. GI: Soft, nondistended, nontender, no rebound pain, no organomegaly, BS present. GU: No hematuria Ext: No pitting leg edema bilaterally. 1+DP/PT pulse bilaterally. Musculoskeletal: No joint deformities, No joint redness or warmth, no limitation of ROM in spin. Skin: No rashes.  Neuro: Alert, oriented X3, cranial nerves II-XII grossly intact, moves all extremities normally.  Psych: Patient is not psychotic, no suicidal or hemocidal ideation.  Labs on Admission: I have personally reviewed following labs and imaging studies  CBC: Recent Labs  Lab 07/09/22 0614  WBC 6.3  NEUTROABS 2.9  HGB 10.4*  HCT 29.5*  MCV 84.5  PLT 167   Basic Metabolic Panel: Recent Labs  Lab 07/09/22 0614 07/09/22 1011  NA 125* 131*  K 2.3* 3.2*  CL 89* 97*  CO2 23 23  GLUCOSE 118* 143*  BUN <5* <5*  CREATININE 0.69 0.68  CALCIUM 8.6* 8.3*  MG 1.8  --   PHOS  --  2.8   GFR: Estimated Creatinine Clearance: 95.7 mL/min (by C-G formula based on SCr of 0.68 mg/dL). Liver Function Tests: Recent Labs  Lab 07/09/22 1011  AST 70*  ALT 46*  ALKPHOS 90  BILITOT 2.1*  PROT 7.5  ALBUMIN 4.0   No results for input(s): "LIPASE", "AMYLASE" in the last 168 hours. No results for input(s): "AMMONIA" in the last 168  hours. Coagulation Profile: No results for input(s): "INR", "PROTIME" in the last 168 hours. Cardiac Enzymes: No results for input(s): "CKTOTAL", "CKMB", "CKMBINDEX", "TROPONINI" in the last 168 hours. BNP (last 3 results) No results for input(s): "PROBNP" in the last 8760 hours. HbA1C: No results for input(s): "HGBA1C" in the last 72 hours. CBG: No results for input(s): "GLUCAP" in the last 168 hours. Lipid Profile: No results for input(s): "CHOL", "HDL", "LDLCALC", "TRIG", "CHOLHDL", "LDLDIRECT" in the last 72 hours. Thyroid Function Tests: No results for input(s): "TSH", "T4TOTAL", "FREET4", "T3FREE", "THYROIDAB" in the last 72 hours. Anemia Panel: No results for input(s): "VITAMINB12", "FOLATE", "FERRITIN", "TIBC", "IRON", "RETICCTPCT" in the last 72 hours. Urine analysis:    Component Value Date/Time  COLORURINE STRAW (A) 08/09/2015 1449   APPEARANCEUR CLEAR (A) 08/09/2015 1449   LABSPEC 1.004 (L) 08/09/2015 1449   PHURINE 7.0 08/09/2015 1449   GLUCOSEU NEGATIVE 08/09/2015 1449   HGBUR NEGATIVE 08/09/2015 1449   BILIRUBINUR NEGATIVE 08/09/2015 1449   KETONESUR NEGATIVE 08/09/2015 1449   PROTEINUR NEGATIVE 08/09/2015 1449   NITRITE NEGATIVE 08/09/2015 1449   LEUKOCYTESUR NEGATIVE 08/09/2015 1449   Sepsis Labs: @LABRCNTIP (procalcitonin:4,lacticidven:4) )No results found for this or any previous visit (from the past 240 hour(s)).   Radiological Exams on Admission: No results found.    Assessment/Plan Principal Problem:   Electrolyte disturbance Active Problems:   Hyponatremia   Hypokalemia   Alcohol abuse   History of gout   Hypertension   Chronic diarrhea   Sickle cell trait (HCC)   Insomnia   Abnormal LFTs   Assessment and Plan:  Electrolyte disturbance: Sodium 125. Most likely due to alcohol abuse and decreased oral intake.  Patient's mental status is normal.  - place in tele bed for obs --> changed to med-surg bed. - Will check urine sodium, urine  osmolality, serum osmolality. - Fluid restriction -- > d/c'ed - IVF: 1L NS in ED, will continue with IV normal saline at 75 mL/h - f/u by BMP q8h - avoid over correction too fast due to risk of central pontine myelinolysis - hold HCTZ  Addendum: Sodium level increased from 125 on 6:14 to 131 on 10:11 AM which is too fast. -will d/c fluid restriction -DC normal saline infusion -Start D5 at 50 cc/h - f/u by BMP q8h   Hypokalemia: Potassium 2.3.  Magnesium 1.8 -Repleted potassium -Give 2 g magnesium sulfate -Check phosphorus level  Alcohol abuse -Concerning about importance of quitting alcohol use. -CIWA protocol -Consult to TOC  History of gout -Continue home allopurinol, colchicine as needed  Hypertension -Hold HCTZ -Atenolol 50 mg daily -Amlodipine -IV hydralazine as needed  Chronic diarrhea: This is a chronic issue, no change in diarrhea pattern, low suspicions for C. difficile. -Started Imodium as needed  Sickle cell trait [facility]): Hemoglobin 10.4 (11.0 on 01/08/2022). -Follow-up by CBC  Insomnia -Ambien  Abnormal liver function: ALT 90, AST 70, ALT 46, total bilirubin 2.1.  Likely due to alcohol abuse -Check Tylenol level --> < 10 -check hepatitis panel -Avoid using Tylenol    DVT ppx:   SQ Lovenox  Code Status: Full code  Family Communication:  Yes, patient's mother at bed side.     Disposition Plan:  Anticipate discharge back to previous environment  Consults called:  none  Admission status and Level of care: Telemetry Medical:     for obs      Dispo: The patient is from: Home              Anticipated d/c is to: Home              Anticipated d/c date is: 1 day              Patient currently is not medically stable to d/c.    Severity of Illness:  The appropriate patient status for this patient is OBSERVATION. Observation status is judged to be reasonable and necessary in order to provide the required intensity of service to ensure the  patient's safety. The patient's presenting symptoms, physical exam findings, and initial radiographic and laboratory data in the context of their medical condition is felt to place them at decreased risk for further clinical deterioration. Furthermore, it is anticipated that the patient will  be medically stable for discharge from the hospital within 2 midnights of admission.        Date of Service 07/09/2022    Ivor Costa [facility]   If 7PM-7AM, please contact night-coverage [email or web address] 07/09/2022, 11:34 AM

## 2022-07-09 NOTE — Consult Note (Signed)
PHARMACY CONSULT NOTE - FOLLOW UP  Pharmacy Consult for Electrolyte Monitoring and Replacement   Recent Labs: Potassium (mmol/L)  Date Value  07/09/2022 2.3 (LL)   Magnesium (mg/dL)  Date Value  07/09/2022 1.8   Calcium (mg/dL)  Date Value  07/09/2022 8.6 (L)   Albumin (g/dL)  Date Value  01/08/2022 4.3   Sodium (mmol/L)  Date Value  07/09/2022 125 (L)  01/08/2022 138     Assessment: Pharmacy has been consulted to monitor and assist in the replacing of electrolytes in 46yo female with PMH of HTN, gout, sickle cell trait, chronic diarrhea, s/p bariatric surgery who presented to the ED with dizziness and insomnia for the past few days. Patient states that she has chronic intermittent diarrhea ever since she had gastric bypass procedure.  Her diarrhea pattern has not changed. She also stated she has been drinking beers heavily on a daily basis.  IVF: NS@75ml /hr  Goal of Therapy:  Electrolytes WNL  Plan:  K 2.3 - KCL 64mEq IV x 3 doses + KCL 73mEq x 3 doses Na 125 - continue NS@75ml /h  - No further replacement needed - Will recheck electrolytes with AM labs  Pearla Dubonnet ,PharmD Clinical Pharmacist 07/09/2022 9:11 AM

## 2022-07-09 NOTE — Progress Notes (Signed)
PHARMACIST - PHYSICIAN COMMUNICATION  CONCERNING:  Enoxaparin (Lovenox) for DVT Prophylaxis    RECOMMENDATION: Patient was prescribed enoxaprin 40mg  q24 hours for VTE prophylaxis.   Filed Weights   07/09/22 0538  Weight: 99.2 kg (218 lb 11.1 oz)    Body mass index is 40 kg/m.  Estimated Creatinine Clearance: 95.7 mL/min (by C-G formula based on SCr of 0.69 mg/dL).   Based on [facility] patient is candidate for enoxaparin 0.5mg /kg TBW SQ every 24 hours based on BMI being >30.    DESCRIPTION: Pharmacy has adjusted enoxaparin dose per [facility] policy.  Patient is now receiving enoxaparin 50 mg every 24 hours    Pernell Dupre, PharmD Clinical Pharmacist  07/09/2022 7:48 AM

## 2022-07-09 NOTE — Consult Note (Signed)
[facility] for Electrolyte Monitoring and Replacement   Recent Labs: Potassium (mmol/L)  Date Value  07/09/2022 3.2 (L)   Magnesium (mg/dL)  Date Value  07/09/2022 1.8   Calcium (mg/dL)  Date Value  07/09/2022 8.6 (L)   Albumin (g/dL)  Date Value  07/09/2022 4.0  01/08/2022 4.3   Phosphorus (mg/dL)  Date Value  07/09/2022 2.8   Sodium (mmol/L)  Date Value  07/09/2022 133 (L)  01/08/2022 138     Assessment: Pharmacy has been consulted to monitor and assist in the replacing of electrolytes in 46yo female with PMH of HTN, gout, sickle cell trait, chronic diarrhea, s/p bariatric surgery who presented to the ED with dizziness and insomnia for the past few days. Patient states that she has chronic intermittent diarrhea ever since she had gastric bypass procedure.  Her diarrhea pattern has not changed. She also stated she has been drinking beers heavily on a daily basis.  IVF: dextrose 5 % at 50 mL/hr  Goal of Therapy:  Electrolytes WNL  Plan:  - oral KCL 5mEq x 2 doses - BMP ordered q8h  Dallie Piles ,PharmD Clinical Pharmacist 07/09/2022 6:16 PM

## 2022-07-09 NOTE — ED Triage Notes (Signed)
Patient ambulatory to triage with steady gait, without difficulty or distress noted; pt reports diff sleeping for last several days, legs "tingling"; taking tylenol pm without relief

## 2022-07-09 NOTE — ED Provider Notes (Signed)
[facility] Provider Note    Event Date/Time   First MD Initiated Contact with Patient 07/09/22 0543     (approximate)   History   Insomnia   HPI  Kara Murray is a 46 y.o. female who presents for evaluation of insomnia, and persistent alcohol use, which she disclosed to me when I evaluated her.  Her main triage reason for coming in was that she has not slept for several days.  I asked her what brought her in and she said that she thinks she is severely dehydrated.  When I asked why, she says she has been drinking beer for days, eating and drinking very little else, and has not been able to sleep.  She said she has been through a lot but did not go into details.  She does not have any thoughts of harming yourself or others but she is very frustrated and wants to sleep.  She has appointment scheduled with her primary care doctor in 2 days, but she did not think she could wait, so she asked her mother and her son to bring her in tonight to the emergency department.  She says she feels tired but feels like she cannot sleep.  She is that she is drinking beer to cope with things that are bothering her.  She sometimes smokes marijuana but denies any other drug use.  She denies any chronic medical issues.  She has tried taking Tylenol PM but it does not seem to help.     Physical Exam   Triage Vital Signs: ED Triage Vitals  Enc Vitals Group     BP 07/09/22 0540 (!) 141/95     Pulse Rate 07/09/22 0540 96     Resp 07/09/22 0540 20     Temp 07/09/22 0540 97.6 F (36.4 C)     Temp Source 07/09/22 0540 Oral     SpO2 07/09/22 0540 100 %     Weight 07/09/22 0538 99.2 kg (218 lb 11.1 oz)     Height 07/09/22 0538 1.575 m (5\' 2" )     Head Circumference --      Peak Flow --      Pain Score 07/09/22 0535 0     Pain Loc --      Pain Edu? --      Excl. in GC? --     Most recent vital signs: Vitals:   07/09/22 0540 07/09/22 0600  BP: (!) 141/95 131/87   Pulse: 96 86  Resp: 20   Temp: 97.6 F (36.4 C)   SpO2: 100% 100%     General: Awake, no distress.  CV:  Good peripheral perfusion.  Normal heart sounds.  Regular rate and rhythm Resp:  Normal effort.  Lungs are clear to auscultation bilaterally. Abd:  No distention.  No tenderness to palpation.  Obese. Other:  Patient does not appear clinically intoxicated.  She is awake and alert, conversant, frustrated that she cannot sleep, admits to excessive alcohol use, but denies SI and HI.   ED Results / Procedures / Treatments   Labs (all labs ordered are listed, but only abnormal results are displayed) Labs Reviewed  CBC WITH DIFFERENTIAL/PLATELET - Abnormal; Notable for the following components:      Result Value   RBC 3.49 (*)    Hemoglobin 10.4 (*)    HCT 29.5 (*)    RDW 18.7 (*)    nRBC 0.5 (*)    All other components within normal  limits  BASIC METABOLIC PANEL - Abnormal; Notable for the following components:   Sodium 125 (*)    Potassium 2.3 (*)    Chloride 89 (*)    Glucose, Bld 118 (*)    BUN <5 (*)    Calcium 8.6 (*)    All other components within normal limits  MAGNESIUM  ETHANOL     PROCEDURES:  Critical Care performed: No  Procedures   MEDICATIONS ORDERED IN ED: Medications  potassium chloride 10 mEq in 100 mL IVPB (10 mEq Intravenous New Bag/Given 07/09/22 0652)  magnesium sulfate IVPB 2 g 50 mL (has no administration in time range)  thiamine (VITAMIN B1) injection 100 mg (has no administration in time range)  folic acid (FOLVITE) tablet 1 mg (has no administration in time range)  sodium chloride 0.9 % bolus 1,000 mL (1,000 mLs Intravenous New Bag/Given 07/09/22 0620)  potassium chloride SA (KLOR-CON M) CR tablet 40 mEq (40 mEq Oral Given 07/09/22 0650)     IMPRESSION / MDM / ASSESSMENT AND PLAN / ED COURSE  I reviewed the triage vital signs and the nursing notes.                              Differential diagnosis includes, but is not limited to,  nonspecific insomnia, electrolyte or metabolic abnormality including beer Potomania, alcoholic ketoacidosis, depression, substance abuse, withdrawal.  Patient's presentation is most consistent with acute complicated illness / injury requiring diagnostic workup.  Patient is exhibiting no concerning psychiatric signs or symptoms that would justify involuntary commitment or even psychiatry consultation.  Her vital signs are stable and within normal limits other than hypertension which is likely not clinically relevant.  Given her admission of persistent recent alcohol abuse, anticipate she will have some electrolyte abnormalities, and possibly a more serious condition such as beer Poto mania or alcoholic ketoacidosis.  She has not admitted signs or symptoms of obvious alcohol withdrawal and certainly no complicated symptoms.  However she may benefit from CIWA protocol if she stays in the ED or in the hospital.  Labs ordered: BMP (she has no abdominal pain and I do not think that LFTs or lipase are necessary), CBC with differential, magnesium level.  I ordered 1 L normal saline IV bolus for probable volume depletion/dehydration.  Will reassess after labs are back.    Clinical Course as of 07/09/22 2247  Wed Jul 09, 2022  5621 Patient's electrolytes are significantly abnormal with a sodium of 125 and proximal more importantly potassium of 2.3.  Magnesium is within normal limits at 1.8, however I ordered magnesium 2 g IV to assist with potassium absorption, along with potassium 40 mill equivalents by mouth and 10 mill equivalents IV. [CF]  0645 She is already receiving a 1 L normal saline IV bolus. [CF]  3086 Basic metabolic panel(!!) Of note, she has an appropriate glucose and a normal anion gap. [CF]  [phone] CBC with Differential(!) Reassuring CBC [CF]  [id number] I discussed the results with the patient.  Given the severity of her hypokalemia which easily meets inpatient criteria, as well as her  hyponatremia and her ongoing alcohol abuse, she agreed that she should stay in the hospital.  I am consulting the hospitalist for admission. [CF]  [id number] I am also ordering CIWA protocol although she has no evidence of alcohol withdrawal at this time.  I ordered thiamine 100 mg IV and folate 1 mg p.o. [CF]  6962 Alcohol,  Ethyl (B): <10 [CF]    Clinical Course User Index [CF] [place]   [address]:  Discussed case with Dr. Scotty Court, who is assuming care in the ED.  He will discussed the case with the hospitalist service to complete the admission.   FINAL CLINICAL IMPRESSION(S) / ED DIAGNOSES   Final diagnoses:  Alcohol abuse  Hypokalemia  Hyponatremia     Rx / DC Orders   ED Discharge Orders     None        Note:  This document was prepared using Dragon voice recognition software and may include unintentional dictation errors.   Loleta Rose, MD 07/09/22 [phone]

## 2022-07-10 DIAGNOSIS — E878 Other disorders of electrolyte and fluid balance, not elsewhere classified: Secondary | ICD-10-CM | POA: Diagnosis not present

## 2022-07-10 LAB — BASIC METABOLIC PANEL
Anion gap: 9 (ref 5–15)
Anion gap: 11 (ref 5–15)
Anion gap: 10 (ref 5–15)
BUN: <5 mg/dL — ABNORMAL LOW (ref 6–20)
BUN: <5 mg/dL — ABNORMAL LOW (ref 6–20)
BUN: <5 mg/dL — ABNORMAL LOW (ref 6–20)
CO2: 23 mmol/L (ref 22–32)
CO2: 23 mmol/L (ref 22–32)
CO2: 25 mmol/L (ref 22–32)
Calcium: 8.5 mg/dL — ABNORMAL LOW (ref 8.9–10.3)
Calcium: 9.1 mg/dL (ref 8.9–10.3)
Calcium: 8.9 mg/dL (ref 8.9–10.3)
Chloride: 102 mmol/L (ref 98–111)
Chloride: 101 mmol/L (ref 98–111)
Chloride: 100 mmol/L (ref 98–111)
Creatinine, Ser: 0.66 mg/dL (ref 0.44–1.00)
Creatinine, Ser: 0.79 mg/dL (ref 0.44–1.00)
Creatinine, Ser: 0.66 mg/dL (ref 0.44–1.00)
GFR, Estimated: >60 mL/min (ref >60)
GFR, Estimated: >60 mL/min (ref >60)
GFR, Estimated: >60 mL/min (ref >60)
Glucose, Bld: 130 mg/dL — ABNORMAL HIGH (ref 70–99)
Glucose, Bld: 116 mg/dL — ABNORMAL HIGH (ref 70–99)
Glucose, Bld: 102 mg/dL — ABNORMAL HIGH (ref 70–99)
Potassium: 3.6 mmol/L (ref 3.5–5.1)
Potassium: 3.2 mmol/L — ABNORMAL LOW (ref 3.5–5.1)
Potassium: 3.3 mmol/L — ABNORMAL LOW (ref 3.5–5.1)
Sodium: 134 mmol/L — ABNORMAL LOW (ref 135–145)
Sodium: 135 mmol/L (ref 135–145)
Sodium: 135 mmol/L (ref 135–145)

## 2022-07-10 LAB — HEPATITIS PANEL, ACUTE
HCV Ab: NONREACTIVE
Hep A IgM: NONREACTIVE
Hep B C IgM: NONREACTIVE
Hepatitis B Surface Ag: NONREACTIVE

## 2022-07-10 LAB — HIV ANTIBODY (ROUTINE TESTING W REFLEX): HIV Screen 4th Generation wRfx: NONREACTIVE

## 2022-07-10 LAB — PHOSPHORUS: Phosphorus: 1.7 mg/dL — ABNORMAL LOW (ref 2.5–4.6)

## 2022-07-10 LAB — MAGNESIUM: Magnesium: 2.3 mg/dL (ref 1.7–2.4)

## 2022-07-10 LAB — GLUCOSE, CAPILLARY: Glucose-Capillary: 129 mg/dL — ABNORMAL HIGH (ref 70–99)

## 2022-07-10 MED ORDER — ZOLPIDEM TARTRATE 5 MG PO TABS: 5.0000 mg | ORAL_TABLET | Freq: Every evening | ORAL | 0 refills | Status: DC | PRN

## 2022-07-10 MED ORDER — POTASSIUM PHOSPHATES 15 MMOLE/5ML IV SOLN
30.0000 mmol | Freq: Once | INTRAVENOUS | Status: AC
  Administered 2022-07-10: 30 mmol via INTRAVENOUS
  Filled 2022-07-10: qty 10

## 2022-07-10 MED ORDER — POTASSIUM CHLORIDE CRYS ER 20 MEQ PO TBCR: 20.0000 meq | EXTENDED_RELEASE_TABLET | Freq: Two times a day (BID) | ORAL | 0 refills | Status: DC

## 2022-07-10 MED ORDER — CALCIUM GLUCONATE-NACL 1-0.675 GM/50ML-% IV SOLN
1.0000 g | Freq: Once | INTRAVENOUS | Status: AC
  Administered 2022-07-10: 1000 mg via INTRAVENOUS
  Filled 2022-07-10: qty 50

## 2022-07-10 MED ORDER — ATENOLOL 50 MG PO TABS: 50.0000 mg | ORAL_TABLET | Freq: Every day | ORAL | 0 refills | Status: DC

## 2022-07-10 NOTE — Plan of Care (Signed)
  Problem: Education: Goal: Knowledge of General Education information will improve Description: Including pain rating scale, medication(s)/side effects and non-pharmacologic comfort measures 07/10/2022 0428 by Rogelia Rohrer, LPN Outcome: Progressing 07/10/2022 0428 by Rogelia Rohrer, LPN Outcome: Progressing   Problem: Health Behavior/Discharge Planning: Goal: Ability to manage health-related needs will improve 07/10/2022 0428 by Rogelia Rohrer, LPN Outcome: Progressing 07/10/2022 0428 by Rogelia Rohrer, LPN Outcome: Progressing   Problem: Clinical Measurements: Goal: Ability to maintain clinical measurements within normal limits will improve 07/10/2022 0428 by Rogelia Rohrer, LPN Outcome: Progressing 07/10/2022 0428 by Rogelia Rohrer, LPN Outcome: Progressing Goal: Will remain free from infection 07/10/2022 0428 by Rogelia Rohrer, LPN Outcome: Progressing 07/10/2022 0428 by Rogelia Rohrer, LPN Outcome: Progressing Goal: Diagnostic test results will improve 07/10/2022 0428 by Rogelia Rohrer, LPN Outcome: Progressing 07/10/2022 0428 by Rogelia Rohrer, LPN Outcome: Progressing Goal: Respiratory complications will improve 07/10/2022 0428 by Rogelia Rohrer, LPN Outcome: Progressing 07/10/2022 0428 by Rogelia Rohrer, LPN Outcome: Progressing Goal: Cardiovascular complication will be avoided 07/10/2022 0428 by Rogelia Rohrer, LPN Outcome: Progressing 07/10/2022 0428 by Rogelia Rohrer, LPN Outcome: Progressing   Problem: Activity: Goal: Risk for activity intolerance will decrease 07/10/2022 0428 by Rogelia Rohrer, LPN Outcome: Progressing 07/10/2022 0428 by Rogelia Rohrer, LPN Outcome: Progressing   Problem: Nutrition: Goal: Adequate nutrition will be maintained 07/10/2022 0428 by Rogelia Rohrer, LPN Outcome: Progressing 07/10/2022 0428 by Rogelia Rohrer, LPN Outcome: Progressing   Problem: Coping: Goal: Level of anxiety will  decrease 07/10/2022 0428 by Rogelia Rohrer, LPN Outcome: Progressing 07/10/2022 0428 by Rogelia Rohrer, LPN Outcome: Progressing   Problem: Elimination: Goal: Will not experience complications related to bowel motility 07/10/2022 0428 by Rogelia Rohrer, LPN Outcome: Progressing 07/10/2022 0428 by Rogelia Rohrer, LPN Outcome: Progressing Goal: Will not experience complications related to urinary retention 07/10/2022 0428 by Rogelia Rohrer, LPN Outcome: Progressing 07/10/2022 0428 by Rogelia Rohrer, LPN Outcome: Progressing   Problem: Pain Managment: Goal: General experience of comfort will improve 07/10/2022 0428 by Rogelia Rohrer, LPN Outcome: Progressing 07/10/2022 0428 by Rogelia Rohrer, LPN Outcome: Progressing   Problem: Safety: Goal: Ability to remain free from injury will improve 07/10/2022 0428 by Rogelia Rohrer, LPN Outcome: Progressing 07/10/2022 0428 by Rogelia Rohrer, LPN Outcome: Progressing   Problem: Skin Integrity: Goal: Risk for impaired skin integrity will decrease 07/10/2022 0428 by Rogelia Rohrer, LPN Outcome: Progressing 07/10/2022 0428 by Rogelia Rohrer, LPN Outcome: Progressing

## 2022-07-10 NOTE — Hospital Course (Addendum)
Kara Murray is a 46 y.o. female with medical history significant of hypertension, gout, sickle cell trait, alcohol abuse, chronic diarrhea, s/p s/p of bariatric surgery, who presents with insomnia. Pt has been drinking beers heavily at the daily basis. She states that she has not been feeling good in the past several days - difficulty sleeping, muscle cramps in both legs and tingling in both legs - poor appetite and decreased oral intake. Has chronic intermittent diarrhea ever since she had gastric bypass procedure, this pattern has not changed.    11/01: In ED, hyponatremic, sodium 125, most likely due to EtOH/decreased p.o. intake.  Also hypokalemic, potassium 2.3.  Hypomagnesemia, Mg 1.8. Was admitted, initially placed on fluid restriction IV fluids but this was held due to relatively rapid sodium correction, hold home HCTZ.  Serum alcohol <10, UDS negative. 11/02: VSS, Na 134, K 3.6, Phos 1.7, Mg 2.3.  NR Hep A/B/C, HIV.  Consultants:  none  Procedures: none      ASSESSMENT & PLAN:   Principal Problem:   Electrolyte disturbance Active Problems:   Hyponatremia   Hypokalemia   Alcohol abuse   History of gout   Hypertension   Chronic diarrhea   Sickle cell trait (HCC)   Insomnia   Abnormal LFTs  Hyponatremia - Sodium on admission was 125 --> normalized w/ IV fluids  Most likely due to alcohol abuse and decreased oral intake.   Holding thiazide on discharge Follow outpatient    Hypokalemia - Potassium was 2.3.  Hypomagnesemia - Magnesium was 1.8 Repleted potassium Give 2 g magnesium sulfate   Alcohol abuse Counseled importance of quitting/limiting alcohol use. CIWA protocol Consult to [facility]   History of gout Continue home allopurinol, colchicine as needed   Hypertension Hold thiazide Atenolol 50 mg daily Amlodipine IV hydralazine as needed   Chronic diarrhea:  low suspicions for C. difficile. Started Imodium as needed   Sickle cell trait [facility]):   Hemoglobin 10.4 (11.0 on 01/08/2022). Follow-up by CBC   Insomnia Ambien   Abnormal liver function:  ALT 90, AST 70, ALT 46, total bilirubin 2.1.   Likely due to alcohol abuse Check Tylenol level --> < 10 check hepatitis panel --> NR Avoid using Tylenol Follow outpatient    ]

## 2022-07-10 NOTE — Discharge Summary (Signed)
Physician Discharge Summary   Patient: Kara Murray MRN: [id number]  DOB: Jan 19, 1976   Admit:     Date of Admission: 07/09/2022 Admitted from: home   Discharge: Date of discharge: 07/10/22 Disposition: Home Condition at discharge: good  CODE STATUS: FULL CODE     Discharge Physician: Sunnie Nielsen, DO [facility]     PCP: [name], MD  Recommendations for Outpatient Follow-up:  Follow up with PCP [name], MD as scheduled tomorrow  Please obtain labs/tests: BMP, Mg, Phos Please follow up on the following pending results: none PCP AND OTHER OUTPATIENT PROVIDERS: SEE BELOW FOR SPECIFIC DISCHARGE INSTRUCTIONS PRINTED FOR PATIENT IN ADDITION TO GENERIC AVS PATIENT INFO    Discharge Instructions     Diet - low sodium heart healthy   Complete by: As directed    Discharge instructions   Complete by: As directed    Keep follow up appointment tomorrow PCP should check metabolic panel for sodium and potassium and address chronic diarrhea and insomnia  STOP atenolol-chlorthalidone (chlorthalidone can cause drop in sodium)   Increase activity slowly   Complete by: As directed    No wound care   Complete by: As directed          Discharge Diagnoses: Principal Problem:   Electrolyte disturbance Active Problems:   Hyponatremia   Hypokalemia   Alcohol abuse   History of gout   Hypertension   Chronic diarrhea   Sickle cell trait (HCC)   Insomnia   Abnormal LFTs       Hospital Course: Kara Murray is a 46 y.o. female with medical history significant of hypertension, gout, sickle cell trait, alcohol abuse, chronic diarrhea, s/p s/p of bariatric surgery, who presents with insomnia. Pt has been drinking beers heavily at the daily basis. She states that she has not been feeling good in the past several days - difficulty sleeping, muscle cramps in both legs and tingling in both legs - poor appetite and decreased oral  intake. Has chronic intermittent diarrhea ever since she had gastric bypass procedure, this pattern has not changed.    11/01: In ED, hyponatremic, sodium 125, most likely due to EtOH/decreased p.o. intake.  Also hypokalemic, potassium 2.3.  Hypomagnesemia, Mg 1.8. Was admitted, initially placed on fluid restriction IV fluids but this was held due to relatively rapid sodium correction, hold home HCTZ.  Serum alcohol <10, UDS negative. 11/02: VSS, Na 134, K 3.6, Phos 1.7, Mg 2.3.  NR Hep A/B/C, HIV. Na corrected, K still slightly low will d/c on supplementation. Pt states she has f/u w/ PCP tomorrow and is feeling better this morning, labs in PM ok, discharged home   Consultants:  none  Procedures: none      ASSESSMENT & PLAN:   Principal Problem:   Electrolyte disturbance Active Problems:   Hyponatremia   Hypokalemia   Alcohol abuse   History of gout   Hypertension   Chronic diarrhea   Sickle cell trait (HCC)   Insomnia   Abnormal LFTs  Hyponatremia - Sodium on admission was 125 --> normalized w/ IV fluids  Most likely due to alcohol abuse and decreased oral intake.   Holding thiazide on discharge Follow outpatient    Hypokalemia - Potassium was 2.3.  Hypomagnesemia - Magnesium was 1.8 Repleted potassium Give 2 g magnesium sulfate   Alcohol abuse Counseled importance of quitting/limiting alcohol use. CIWA protocol Consult to [facility]   History of gout Continue home allopurinol, colchicine  as needed   Hypertension Hold thiazide Atenolol 50 mg daily Amlodipine IV hydralazine as needed   Chronic diarrhea:  low suspicions for C. difficile. Started Imodium as needed   Sickle cell trait [facility]):  Hemoglobin 10.4 (11.0 on 01/08/2022). Follow-up by CBC   Insomnia Ambien   Abnormal liver function:  ALT 90, AST 70, ALT 46, total bilirubin 2.1.   Likely due to alcohol abuse Check Tylenol level --> < 10 check hepatitis panel --> NR Avoid using Tylenol Follow  outpatient    ]        Discharge Instructions  Allergies as of 07/10/2022   No Known Allergies      Medication List     STOP taking these medications    amoxicillin-clavulanate 875-125 MG tablet Commonly known as: AUGMENTIN   atenolol-chlorthalidone 50-25 MG tablet Commonly known as: TENORETIC   methylPREDNISolone 4 MG Tbpk tablet Commonly known as: MEDROL DOSEPAK       TAKE these medications    allopurinol 100 MG tablet Commonly known as: ZYLOPRIM Take 1 tablet (100 mg total) by mouth daily.   amLODipine 5 MG tablet Commonly known as: NORVASC Take 1 tablet (5 mg total) by mouth daily.   atenolol 50 MG tablet Commonly known as: TENORMIN Take 1 tablet (50 mg total) by mouth daily. Start taking on: July 11, 2022   colchicine 0.6 MG tablet Take 0.6 mg by mouth daily.   levonorgestrel 20 MCG/24HR IUD Commonly known as: MIRENA 1 Intra Uterine Device (1 each total) by Intrauterine route once for 1 dose.   potassium chloride SA 20 MEQ tablet Commonly known as: KLOR-CON M Take 1 tablet (20 mEq total) by mouth 2 (two) times daily for 2 days.   zolpidem 5 MG tablet Commonly known as: AMBIEN Take 1 tablet (5 mg total) by mouth at bedtime as needed for sleep.          No Known Allergies   Subjective: pt reports feeling better today, no concerns, eating/drinkning better    Discharge Exam: BP 135/86 (BP Location: Right Arm)   Pulse 90   Temp 97.8 F (36.6 C)   Resp 20   Ht 5\' 2"  (1.575 m)   Wt 99.2 kg   SpO2 100%   BMI 40.00 kg/m  General: Pt is alert, awake, not in acute distress Cardiovascular: RRR, S1/S2 +, no rubs, no gallops Respiratory: CTA bilaterally, no wheezing, no rhonchi Abdominal: Soft, NT, ND, bowel sounds + Extremities: no edema, no cyanosis     The results of significant diagnostics from this hospitalization (including imaging, microbiology, ancillary and laboratory) are listed below for reference.      Microbiology: No results found for this or any previous visit (from the past 240 hour(s)).   Labs: BNP (last 3 results) No results for input(s): "BNP" in the last 8760 hours. Basic Metabolic Panel: Recent Labs  Lab 07/09/22 0614 07/09/22 1011 07/09/22 1736 07/10/22 0055 07/10/22 0918 07/10/22 1620  NA 125* 131* 133* 134* 135 135  K 2.3* 3.2* 3.2* 3.6 3.2* 3.3*  CL 89* 97* 100 102 101 100  CO2 23 23 23 23 23 25   GLUCOSE 118* 143* 126* 130* 116* 102*  BUN <5* <5* <5* <5* <5* <5*  CREATININE 0.69 0.68 0.69 0.66 0.79 0.66  CALCIUM 8.6* 8.3* 8.6* 8.5* 9.1 8.9  MG 1.8  --   --  2.3  --   --   PHOS  --  2.8  --  1.7*  --   --  Liver Function Tests: Recent Labs  Lab 07/09/22 1011  AST 70*  ALT 46*  ALKPHOS 90  BILITOT 2.1*  PROT 7.5  ALBUMIN 4.0   No results for input(s): "LIPASE", "AMYLASE" in the last 168 hours. No results for input(s): "AMMONIA" in the last 168 hours. CBC: Recent Labs  Lab 07/09/22 0614  WBC 6.3  NEUTROABS 2.9  HGB 10.4*  HCT 29.5*  MCV 84.5  PLT 167   Cardiac Enzymes: No results for input(s): "CKTOTAL", "CKMB", "CKMBINDEX", "TROPONINI" in the last 168 hours. BNP: Invalid input(s): "POCBNP" CBG: Recent Labs  Lab 07/10/22 0846  GLUCAP 129*   D-Dimer No results for input(s): "DDIMER" in the last 72 hours. Hgb A1c No results for input(s): "HGBA1C" in the last 72 hours. Lipid Profile No results for input(s): "CHOL", "HDL", "LDLCALC", "TRIG", "CHOLHDL", "LDLDIRECT" in the last 72 hours. Thyroid function studies No results for input(s): "TSH", "T4TOTAL", "T3FREE", "THYROIDAB" in the last 72 hours.  Invalid input(s): "FREET3" Anemia work up No results for input(s): "VITAMINB12", "FOLATE", "FERRITIN", "TIBC", "IRON", "RETICCTPCT" in the last 72 hours. Urinalysis    Component Value Date/Time   COLORURINE STRAW (A) 08/09/2015 1449   APPEARANCEUR CLEAR (A) 08/09/2015 1449   LABSPEC 1.004 (L) 08/09/2015 1449   PHURINE 7.0  08/09/2015 1449   GLUCOSEU NEGATIVE 08/09/2015 1449   HGBUR NEGATIVE 08/09/2015 1449   BILIRUBINUR NEGATIVE 08/09/2015 1449   KETONESUR NEGATIVE 08/09/2015 1449   PROTEINUR NEGATIVE 08/09/2015 1449   NITRITE NEGATIVE 08/09/2015 1449   LEUKOCYTESUR NEGATIVE 08/09/2015 1449   Sepsis Labs Recent Labs  Lab 07/09/22 0614  WBC 6.3   Microbiology No results found for this or any previous visit (from the past 240 hour(s)). Imaging No results found.    Time coordinating discharge: over 30 minutes  SIGNED:  Emeterio Reeve DO [facility]

## 2022-07-10 NOTE — Consult Note (Signed)
[place] for Electrolyte Monitoring and Replacement   Recent Labs: Potassium (mmol/L)  Date Value  07/10/2022 3.2 (L)   Magnesium (mg/dL)  Date Value  07/10/2022 2.3   Calcium (mg/dL)  Date Value  07/10/2022 9.1   Albumin (g/dL)  Date Value  07/09/2022 4.0  01/08/2022 4.3   Phosphorus (mg/dL)  Date Value  07/10/2022 1.7 (L)   Sodium (mmol/L)  Date Value  07/10/2022 135  01/08/2022 138     Assessment: Pharmacy has been consulted to monitor and assist in the replacing of electrolytes in 46yo female with PMH of HTN, gout, sickle cell trait, chronic diarrhea, s/p bariatric surgery who presented to the ED with dizziness and insomnia for the past few days. Patient states that she has chronic intermittent diarrhea ever since she had gastric bypass procedure.  Her diarrhea pattern has not changed. She also stated she has been drinking beers heavily on a daily basis.  IVF: dextrose 5 % at 50 mL/hr  Goal of Therapy:  Electrolytes WNL    Plan:  - Kphos 24mmol IV x 1 ordered - recheck electrolytes with AM labs  Pearla Dubonnet, PharmD Clinical Pharmacist 07/10/2022 10:10 AM

## 2022-07-10 NOTE — Consult Note (Addendum)
[place] for Electrolyte Monitoring and Replacement   Recent Labs: Potassium (mmol/L)  Date Value  07/10/2022 3.6   Magnesium (mg/dL)  Date Value  07/09/2022 1.8   Calcium (mg/dL)  Date Value  07/10/2022 8.5 (L)   Albumin (g/dL)  Date Value  07/09/2022 4.0  01/08/2022 4.3   Phosphorus (mg/dL)  Date Value  07/09/2022 2.8   Sodium (mmol/L)  Date Value  07/10/2022 134 (L)  01/08/2022 138     Assessment: Pharmacy has been consulted to monitor and assist in the replacing of electrolytes in 46yo female with PMH of HTN, gout, sickle cell trait, chronic diarrhea, s/p bariatric surgery who presented to the ED with dizziness and insomnia for the past few days. Patient states that she has chronic intermittent diarrhea ever since she had gastric bypass procedure.  Her diarrhea pattern has not changed. She also stated she has been drinking beers heavily on a daily basis.  IVF: dextrose 5 % at 50 mL/hr  Goal of Therapy:  Electrolytes WNL  11/2 0055 K+ = 3.6, Ca++ = 8.5  Plan:  - Order Calcium Gluconate 1 gm IV x 1 - BMP ordered q8h  Renda Rolls, PharmD, MBA 07/10/2022 1:25 AM

## 2022-07-10 NOTE — Plan of Care (Signed)
Patient discharged per MD orders at this time.All discharge instructions,education and medications reviewed with the patient.Pt expressed understanding and will comply with dc instructions.f/u appointments was also communicated to the patient.no verbal c/o or any ssx of distress.Pt was discharged home with self-care per order.Pt was transported home by sister in a privately owned vehicle.

## 2022-07-11 ENCOUNTER — Ambulatory Visit: Payer: BC Managed Care – PPO | Admitting: Family Medicine

## 2022-07-11 ENCOUNTER — Encounter: Payer: Self-pay | Admitting: Family Medicine

## 2022-07-11 VITALS — BP 148/93 | HR 109 | Temp 98.0°F | Wt 217.0 lb

## 2022-07-11 DIAGNOSIS — E871 Hypo-osmolality and hyponatremia: Secondary | ICD-10-CM | POA: Diagnosis not present

## 2022-07-11 DIAGNOSIS — F430 Acute stress reaction: Secondary | ICD-10-CM | POA: Diagnosis not present

## 2022-07-11 DIAGNOSIS — E876 Hypokalemia: Secondary | ICD-10-CM

## 2022-07-11 MED ORDER — PAROXETINE HCL 20 MG PO TABS: ORAL_TABLET | ORAL | 1 refills | Status: DC

## 2022-07-11 NOTE — Progress Notes (Signed)
I,[name] L Chambers,acting as a scribe for Mila Merry, MD.,have documented all relevant documentation on the behalf of Mila Merry, MD,as directed by  Mila Merry, MD while in the presence of Mila Merry, MD.     Established patient visit   Patient: Kara Murray   DOB: 08/01/1976   46 y.o. Female  MRN: [id number] Visit Date: 07/11/2022  Today's healthcare provider: Mila Merry, MD   Chief Complaint  Patient presents with   Hospitalization Follow-up   Subjective    HPI  Follow up Hospitalization  Patient was admitted to [facility] on 07/09/2022 and discharged on 07/10/2022. She was treated for alcohol abuse, hypokalemia and hyponatremia. Treatment for this included IV fluids. Patient was discharged home on potassium supplement. Tenoretic was changed to atenolol.  Telephone follow up was not done She reports good compliance with treatment. She reports this condition is improved.  She reports long history of having 3-5 beers after work most days, but she has been very anxious and depressed over the last year due to death of close family members. States in the weeks prior to ER visit she had not been eating or sleeping and was drinking more alcohol than usual. She was found to have very low potassium of 2.3 and very low sodium of 125, which had improved to 3.3 and 135 the day of discharge.  ----------------------------------------------------------------------------------------- Medications: Outpatient Medications Prior to Visit  Medication Sig   allopurinol (ZYLOPRIM) 100 MG tablet Take 1 tablet (100 mg total) by mouth daily.   amLODipine (NORVASC) 5 MG tablet Take 1 tablet (5 mg total) by mouth daily.   atenolol (TENORMIN) 50 MG tablet Take 1 tablet (50 mg total) by mouth daily.   colchicine 0.6 MG tablet Take 0.6 mg by mouth daily.   potassium chloride SA (KLOR-CON M) 20 MEQ tablet Take 1 tablet (20 mEq total) by mouth 2 (two) times daily for 2 days.    zolpidem (AMBIEN) 5 MG tablet Take 1 tablet (5 mg total) by mouth at bedtime as needed for sleep.   levonorgestrel (MIRENA) 20 MCG/24HR IUD 1 Intra Uterine Device (1 each total) by Intrauterine route once for 1 dose.   No facility-administered medications prior to visit.    Review of Systems  Constitutional:  Negative for appetite change, chills, fatigue and fever.  Respiratory:  Negative for chest tightness and shortness of breath.   Cardiovascular:  Negative for chest pain and palpitations.  Gastrointestinal:  Negative for abdominal pain, nausea and vomiting.  Neurological:  Negative for dizziness and weakness.       Objective    BP (!) 148/93 (BP Location: Left Arm, Patient Position: Sitting, Cuff Size: Large)   Pulse (!) 109   Temp 98 F (36.7 C) (Oral)   Wt 217 lb (98.4 kg)   SpO2 100% Comment: room air  BMI 39.69 kg/m    Today's Vitals   07/11/22 1032 07/11/22 1038  BP: (!) 152/93 (!) 148/93  Pulse: (!) 115 (!) 109  Temp: 98 F (36.7 C)   TempSrc: Oral   SpO2: 100%   Weight: 217 lb (98.4 kg)    Body mass index is 39.69 kg/m.   Physical Exam   General: Appearance:    Obese female. Emotionally labile, often tearful.   Eyes:    PERRL, conjunctiva/corneas clear, EOM's intact       Lungs:     Clear to auscultation bilaterally, respirations unlabored  Heart:    Tachycardic. Normal rhythm. No murmurs,  rubs, or gallops.    MS:   All extremities are intact.    Neurologic:   Awake, alert, oriented x 3. No apparent focal neurological defect.         Assessment & Plan     1. Hypokalemia  2. Hyponatremia Secondary to poor nutritional intake and excessive alcohol (beer) consumption.   - Magnesium - Phosphorus - Comprehensive metabolic panel  3. Acute reaction to situational stress Secondary to death of multiple close family members over the last year.  - PARoxetine (PAXIL) 20 MG tablet; Take one-half table daily for 4 days, then increase to 1 tablet daily if  tolerating  Dispense: 30 tablet; Refill: 1   Future Appointments  Date Time Provider [place]  07/16/2022  1:30 PM [facility] None  07/23/2022  1:30 PM Cammie Sickle, MD [facility] None  08/05/2022 10:40 AM Birdie Sons, MD [facility]  09/16/2022  1:61 PM Copland, Deirdre Evener, PA-C [facility] None        The entirety of the information documented in the History of Present Illness, Review of Systems and Physical Exam were personally obtained by me. Portions of this information were initially documented by the CMA and reviewed by me for thoroughness and accuracy.     Lelon Huh, MD  [facility] [phone] (phone) [phone] (fax)  [place]

## 2022-07-12 ENCOUNTER — Other Ambulatory Visit: Payer: Self-pay | Admitting: Physician Assistant

## 2022-07-12 DIAGNOSIS — M1A0420 Idiopathic chronic gout, left hand, without tophus (tophi): Secondary | ICD-10-CM

## 2022-07-12 LAB — COMPREHENSIVE METABOLIC PANEL
ALT: 90 IU/L — ABNORMAL HIGH (ref 0–32)
AST: 165 IU/L — ABNORMAL HIGH (ref 0–40)
Albumin/Globulin Ratio: 1.6 (ref 1.2–2.2)
Albumin: 4.6 g/dL (ref 3.9–4.9)
Alkaline Phosphatase: 102 IU/L (ref 44–121)
BUN/Creatinine Ratio: 4 — ABNORMAL LOW (ref 9–23)
BUN: 3 mg/dL — ABNORMAL LOW (ref 6–24)
Bilirubin Total: 0.7 mg/dL (ref 0.0–1.2)
CO2: 23 mmol/L (ref 20–29)
Calcium: 9.3 mg/dL (ref 8.7–10.2)
Chloride: 99 mmol/L (ref 96–106)
Creatinine, Ser: 0.69 mg/dL (ref 0.57–1.00)
Globulin, Total: 2.9 g/dL (ref 1.5–4.5)
Glucose: 110 mg/dL — ABNORMAL HIGH (ref 70–99)
Potassium: 3.4 mmol/L — ABNORMAL LOW (ref 3.5–5.2)
Sodium: 139 mmol/L (ref 134–144)
Total Protein: 7.5 g/dL (ref 6.0–8.5)
eGFR: 108 mL/min/{1.73_m2} (ref >59)

## 2022-07-12 LAB — MAGNESIUM: Magnesium: 1.8 mg/dL (ref 1.6–2.3)

## 2022-07-12 LAB — PHOSPHORUS: Phosphorus: 3.7 mg/dL (ref 3.0–4.3)

## 2022-07-14 NOTE — Telephone Encounter (Signed)
Requested medication (s) are due for refill today: yes  Requested medication (s) are on the active medication list: yes  Last refill:  01/09/22 #90 1 RF  Future visit scheduled: yes  Notes to clinic:  was to come back for blood work back in May-did not see order in chart for lab work overdue    Requested Prescriptions  Pending Prescriptions Disp Refills   allopurinol (ZYLOPRIM) 100 MG tablet [Pharmacy Med Name: ALLOPURINOL 100 MG TABLET] 90 tablet 1    Sig: TAKE [place]     Endocrinology:  Gout Agents - allopurinol Failed - 07/12/2022  1:14 AM      Failed - Uric Acid in normal range and within 360 days    Uric Acid  Date Value Ref Range Status  01/08/2022 11.6 (H) 2.6 - 6.2 mg/dL Final    Comment:               Therapeutic target for gout patients: <6.0         Failed - CBC within normal limits and completed in the last 12 months    WBC  Date Value Ref Range Status  07/09/2022 6.3 4.0 - 10.5 K/uL Final   RBC  Date Value Ref Range Status  07/09/2022 3.49 (L) 3.87 - 5.11 MIL/uL Final   Hemoglobin  Date Value Ref Range Status  07/09/2022 10.4 (L) 12.0 - 15.0 g/dL Final  01/08/2022 11.0 (L) 11.1 - 15.9 g/dL Final   HCT  Date Value Ref Range Status  07/09/2022 29.5 (L) 36.0 - 46.0 % Final   Hematocrit  Date Value Ref Range Status  01/08/2022 32.8 (L) 34.0 - 46.6 % Final   MCHC  Date Value Ref Range Status  07/09/2022 35.3 30.0 - 36.0 g/dL Final   [facility]  Date Value Ref Range Status  07/09/2022 29.8 26.0 - 34.0 pg Final   MCV  Date Value Ref Range Status  07/09/2022 84.5 80.0 - 100.0 fL Final  01/08/2022 86 79 - 97 fL Final   No results found for: "PLTCOUNTKUC", "LABPLAT", "POCPLA" RDW  Date Value Ref Range Status  07/09/2022 18.7 (H) 11.5 - 15.5 % Final  01/08/2022 17.9 (H) 11.7 - 15.4 % Final         Passed - Cr in normal range and within 360 days    Creatinine, Ser  Date Value Ref Range Status  07/11/2022 0.69 0.57 - 1.00 mg/dL Final          Passed - Valid encounter within last 12 months    Recent Outpatient Visits           3 days ago Acute reaction to situational stress   [facility] Birdie Sons, MD   6 months ago Primary hypertension   [facility] Thedore Mins, [place], PA-C   6 months ago Acute right-sided low back pain without sciatica   [employer], Dani Gobble, PA-C   1 year ago Essential hypertension   [employer], Vickki Muff, PA-C   2 years ago Acute non-recurrent pansinusitis   [employer], Clearnce Sorrel, [place]       Future Appointments             In 3 weeks Fisher, Kirstie Peri, MD [facility], [place]

## 2022-07-15 ENCOUNTER — Other Ambulatory Visit: Payer: Self-pay | Admitting: *Deleted

## 2022-07-16 ENCOUNTER — Inpatient Hospital Stay: Payer: BC Managed Care – PPO | Attending: Oncology

## 2022-07-23 ENCOUNTER — Ambulatory Visit: Payer: BC Managed Care – PPO | Admitting: Internal Medicine

## 2022-07-24 ENCOUNTER — Inpatient Hospital Stay: Payer: BC Managed Care – PPO | Admitting: Nurse Practitioner

## 2022-07-24 ENCOUNTER — Other Ambulatory Visit: Payer: Self-pay | Admitting: Orthopedic Surgery

## 2022-07-29 ENCOUNTER — Emergency Department
Admission: EM | Admit: 2022-07-29 | Discharge: 2022-07-29 | Disposition: A | Discharge status: Home / Self Care | Payer: BC Managed Care – PPO | Attending: Emergency Medicine | Admitting: Emergency Medicine

## 2022-07-29 DIAGNOSIS — R197 Diarrhea, unspecified: Secondary | ICD-10-CM | POA: Insufficient documentation

## 2022-07-29 DIAGNOSIS — I10 Essential (primary) hypertension: Secondary | ICD-10-CM | POA: Diagnosis not present

## 2022-07-29 DIAGNOSIS — R202 Paresthesia of skin: Secondary | ICD-10-CM | POA: Insufficient documentation

## 2022-07-29 LAB — BASIC METABOLIC PANEL
Anion gap: 11 (ref 5–15)
BUN: <5 mg/dL — ABNORMAL LOW (ref 6–20)
CO2: 21 mmol/L — ABNORMAL LOW (ref 22–32)
Calcium: 8.6 mg/dL — ABNORMAL LOW (ref 8.9–10.3)
Chloride: 103 mmol/L (ref 98–111)
Creatinine, Ser: 0.58 mg/dL (ref 0.44–1.00)
GFR, Estimated: >60 mL/min (ref >60)
Glucose, Bld: 113 mg/dL — ABNORMAL HIGH (ref 70–99)
Potassium: 4.3 mmol/L (ref 3.5–5.1)
Sodium: 135 mmol/L (ref 135–145)

## 2022-07-29 LAB — CBC
HCT: 30.5 % — ABNORMAL LOW (ref 36.0–46.0)
Hemoglobin: 10.3 g/dL — ABNORMAL LOW (ref 12.0–15.0)
MCH: 28.4 pg (ref 26.0–34.0)
MCHC: 33.8 g/dL (ref 30.0–36.0)
MCV: 84 fL (ref 80.0–100.0)
Platelets: 362 10*3/uL (ref 150–400)
RBC: 3.63 MIL/uL — ABNORMAL LOW (ref 3.87–5.11)
RDW: 19.6 % — ABNORMAL HIGH (ref 11.5–15.5)
WBC: 4.8 10*3/uL (ref 4.0–10.5)
nRBC: 0 % (ref 0.0–0.2)

## 2022-07-29 NOTE — ED Provider Notes (Signed)
[facility] Provider Note    Event Date/Time   First MD Initiated Contact with Patient 07/29/22 1716     (approximate)   History   Dehydration   HPI  Kara Murray is a 46 y.o. female with past medical history of hypertension alcohol use disorder chronic diarrhea who presents with concern for dehydration. Patient tells me that she was at work today when she felt tingling in her legs.  She also had multiple episodes of diarrhea.  She does have a history of chronic diarrhea.  Denies abdominal pain denies blood in her stool denies nausea vomiting.  She was admitted at the beginning of this month for hyponatremia hypomagnesemia and hypokalemia in the setting of chronic alcohol use.  Says she felt similar when she came in at that time so she was concerned that she could be dehydrated.  She feels better after she has drank some Pedialyte.  No longer feels the tingling currently.  Took some Pepto-Bismol and has not had an episode of diarrhea since.  He is not currently drinking every day since her hospital admission says she is slowed down but this past weekend she did binge drink because she was at a tailgate.     Past Medical History:  Diagnosis Date   Gout    Hypertension     Patient Active Problem List   Diagnosis Date Noted   Electrolyte disturbance 07/09/2022   Insomnia 07/09/2022   Alcohol abuse 07/09/2022   Hyponatremia 07/09/2022   Hypokalemia 07/09/2022   Chronic diarrhea 07/09/2022   Abnormal LFTs 07/09/2022   Sickle cell trait (HCC) 01/20/2022   Elevated ferritin 01/20/2022   Hypertriglyceridemia 01/08/2022   History of anemia 01/08/2022   Family history of diabetes mellitus (DM) 01/08/2022   History of gout 01/08/2022   Osteoarthritis of knee 11/13/2017   Localized, primary osteoarthritis 11/13/2017   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 --      Excl. in GC? --     Most recent vital signs: Vitals:   07/29/22 1656  BP: (!) 175/95  Pulse: 87  Resp: 18  Temp: 98.1 F (36.7 C)  SpO2: 98%     General: Awake, no distress.  CV:  Good peripheral perfusion.  Resp:  Normal effort.  Abd:  No distention.  Abdomen is soft and nontender Neuro:             Awake, Alert, Oriented x 3  Other:  Dry mucous membranes   ED Results / Procedures / Treatments  Labs (all labs ordered are listed, but only abnormal results are displayed) Labs Reviewed  CBC - Abnormal; Notable for the following components:      Result Value   RBC 3.63 (*)  Hemoglobin 10.3 (*)    HCT 30.5 (*)    RDW 19.6 (*)    All other components within normal limits  BASIC METABOLIC PANEL - Abnormal; Notable for the following components:   CO2 21 (*)    Glucose, Bld 113 (*)    BUN <5 (*)    Calcium 8.6 (*)    All other components within normal limits     EKG     RADIOLOGY    PROCEDURES:  Critical Care performed: No  Procedures     MEDICATIONS ORDERED IN ED: Medications - No data to display   IMPRESSION / MDM / ASSESSMENT AND PLAN / ED COURSE  I reviewed the triage vital signs and the nursing notes.                              Patient's presentation is most consistent with acute complicated illness / injury requiring diagnostic workup.  Differential diagnosis includes, but is not limited to, hyponatremia hypokalemia hypomagnesemia anxiety dehydration  Patient is a 46 year old female with chronic alcohol use disorder presents because of tingling and concern  for dehydration.  She had tingling in her legs at work and then diarrhea.  She has since had Pedialyte no longer has the tingling.  Also has not had an episode of diarrhea since the Pepto-Bismol.  She was primarily concerned about her electrolytes being off and being dehydrated because last time she came to the hospital feeling like this she was hyponatremic and hypokalemia and required admission.  Patient looks well she is hypertensive but vitals are otherwise reassuring she does have somewhat dry mucous membranes abdomen is soft nontender.  She is denying any neurologic symptoms currently.  CBC and BMP are reassuring.  Normal renal function normal sodium and potassium.  Do not feel that patient needs any additional workup at this time.  Provided reassurance.  We discussed alcohol cessation and taking multivitamin as well as discussing her chronic diarrhea with her primary doctor.       FINAL CLINICAL IMPRESSION(S) / ED DIAGNOSES   Final diagnoses:  Tingling in extremities     Rx / DC Orders   ED Discharge Orders     None        Note:  This document was prepared using Dragon voice recognition software and may include unintentional dictation errors.   Georga Hacking, MD 07/29/22 [phone]

## 2022-07-29 NOTE — ED Triage Notes (Signed)
Pt sts that she believes she is dehydrated due to her feeling tingly all over. Pt is resting in triage comfortably in a chair, ambulatory, A/Ox4

## 2022-07-29 NOTE — Discharge Instructions (Addendum)
Your electrolytes were normal.  Please take a multivitamin daily.  Please discuss your diarrhea with your doctor.

## 2022-07-29 NOTE — ED Notes (Signed)
Pt leaves with stable vitals and steady gait. Verbalizes understanding of all discharge instructions. All belongings taken from room.

## 2022-08-01 ENCOUNTER — Other Ambulatory Visit (HOSPITAL_BASED_OUTPATIENT_CLINIC_OR_DEPARTMENT_OTHER): Payer: Self-pay | Admitting: Osteopathic Medicine

## 2022-08-04 ENCOUNTER — Other Ambulatory Visit: Payer: Self-pay | Admitting: Family Medicine

## 2022-08-04 DIAGNOSIS — F430 Acute stress reaction: Secondary | ICD-10-CM

## 2022-08-05 ENCOUNTER — Ambulatory Visit: Payer: BC Managed Care – PPO | Admitting: Family Medicine

## 2022-08-06 ENCOUNTER — Encounter
Admission: RE | Admit: 2022-08-06 | Discharge: 2022-08-06 | Disposition: A | Discharge status: Home / Self Care | Payer: BC Managed Care – PPO | Source: Ambulatory Visit | Attending: Orthopedic Surgery | Admitting: Orthopedic Surgery

## 2022-08-06 VITALS — BP 144/80 | HR 70 | Resp 16 | Ht 62.0 in | Wt 214.3 lb

## 2022-08-06 DIAGNOSIS — Z01818 Encounter for other preprocedural examination: Secondary | ICD-10-CM | POA: Insufficient documentation

## 2022-08-06 DIAGNOSIS — Z789 Other specified health status: Secondary | ICD-10-CM

## 2022-08-06 HISTORY — DX: Sickle-cell trait: D57.3

## 2022-08-06 HISTORY — DX: Cannabis abuse, uncomplicated: F12.10

## 2022-08-06 HISTORY — DX: Unspecified osteoarthritis, unspecified site: M19.90

## 2022-08-06 HISTORY — DX: Hypokalemia: E87.6

## 2022-08-06 HISTORY — DX: Alcohol abuse, uncomplicated: F10.10

## 2022-08-06 HISTORY — DX: Anemia, unspecified: D64.9

## 2022-08-06 HISTORY — DX: Hypo-osmolality and hyponatremia: E87.1

## 2022-08-06 HISTORY — DX: Other specified abnormal findings of blood chemistry: R79.89

## 2022-08-06 LAB — URINALYSIS, ROUTINE W REFLEX MICROSCOPIC
Bilirubin Urine: NEGATIVE
Glucose, UA: NEGATIVE mg/dL
Hgb urine dipstick: NEGATIVE
Ketones, ur: NEGATIVE mg/dL
Nitrite: NEGATIVE
Protein, ur: 30 mg/dL — AB
Specific Gravity, Urine: 1.006 (ref 1.005–1.030)
pH: 6 (ref 5.0–8.0)

## 2022-08-06 LAB — TYPE AND SCREEN
ABO/RH(D): O POS
Antibody Screen: NEGATIVE

## 2022-08-06 LAB — SURGICAL PCR SCREEN
MRSA, PCR: NEGATIVE
Staphylococcus aureus: NEGATIVE

## 2022-08-06 NOTE — Patient Instructions (Addendum)
Your procedure is scheduled on:08-14-22 Thursday Report to the Registration Desk on the 1st floor of the [facility].Then proceed to the 2nd floor Surgery Desk To find out your arrival time, please call [phone] between 1PM - 3PM on:08-13-22 Wednesday If your arrival time is 6:00 am, do not arrive prior to that time as the [facility] entrance doors do not open until 6:00 am.  REMEMBER: Instructions that are not followed completely may result in serious medical risk, up to and including death; or upon the discretion of your surgeon and anesthesiologist your surgery may need to be rescheduled.  Do not eat food after midnight the night before surgery.  No gum chewing, lozengers or hard candies.  You may however, drink CLEAR liquids up to 2 hours before you are scheduled to arrive for your surgery. Do not drink anything within 2 hours of your scheduled arrival time.  Clear liquids include: - water  - apple juice without pulp - gatorade (not RED colors) - black coffee or tea (Do NOT add milk or creamers to the coffee or tea) Do NOT drink anything that is not on this list  In addition, your doctor has ordered for you to drink the provided  Gatorade G2 Drinking this carbohydrate drink up to two hours before surgery helps to reduce insulin resistance and improve patient outcomes. Please complete drinking 2 hours prior to scheduled arrival time.  TAKE THESE MEDICATIONS THE MORNING OF SURGERY WITH A SIP OF WATER: -atenolol (TENORMIN)  -PARoxetine (PAXIL)   One week prior to surgery: Stop Anti-inflammatories (NSAIDS) such as Advil, Aleve, Ibuprofen, Motrin, Naproxen, Naprosyn and Aspirin based products such as Excedrin, Goodys Powder, BC Powder.You may however, continue to take Tylenol if needed for pain up until the day of surgery.  Stop ANY OVER THE COUNTER supplements/vitamins 7 days prior to surgery  No Alcohol for 24 hours before or after surgery.  No Smoking including  e-cigarettes for 24 hours prior to surgery.  No chewable tobacco products for at least 6 hours prior to surgery.  No nicotine patches on the day of surgery.  Do not use any "recreational" drugs for at least a week prior to your surgery.  Please be advised that the combination of cocaine and anesthesia may have negative outcomes, up to and including death. If you test positive for cocaine, your surgery will be cancelled.  On the morning of surgery brush your teeth with toothpaste and water, you may rinse your mouth with mouthwash if you wish. Do not swallow any toothpaste or mouthwash.  Use CHG Soap as directed on instruction sheet.  Do not wear jewelry, make-up, hairpins, clips or nail polish.  Do not wear lotions, powders, or perfumes.   Do not shave body from the neck down 48 hours prior to surgery just in case you cut yourself which could leave a site for infection.  Also, freshly shaved skin may become irritated if using the CHG soap.  Contact lenses, hearing aids and dentures may not be worn into surgery.  Do not bring valuables to the hospital. [facility] is not responsible for any missing/lost belongings or valuables.   Notify your doctor if there is any change in your medical condition (cold, fever, infection).  Wear comfortable clothing (specific to your surgery type) to the hospital.  After surgery, you can help prevent lung complications by doing breathing exercises.  Take deep breaths and cough every 1-2 hours. Your doctor may order a device called an Chiropodist  to help you take deep breaths. When coughing or sneezing, hold a pillow firmly against your incision with both hands. This is called "splinting." Doing this helps protect your incision. It also decreases belly discomfort.  If you are being admitted to the hospital overnight, leave your suitcase in the car. After surgery it may be brought to your room.  If you are being discharged the day of surgery,  you will not be allowed to drive home. You will need a responsible adult (18 years or older) to drive you home and stay with you that night.   If you are taking public transportation, you will need to have a responsible adult (18 years or older) with you. Please confirm with your physician that it is acceptable to use public transportation.   Please call the [facility] at [phone] if you have any questions about these instructions.  Surgery Visitation Policy:  Patients undergoing a surgery or procedure may have two family members or support persons with them as long as the person is not COVID-19 positive or experiencing its symptoms.   Inpatient Visitation:    Visiting hours are 7 a.m. to 8 p.m. Up to four visitors are allowed at one time in a patient room. The visitors may rotate out with other people during the day. One designated support person (adult) may remain overnight.  MASKING: Due to an increase in RSV rates and hospitalizations, starting Wednesday, Nov. 15, in patient care areas in which we serve newborns, infants and children, masks will be required for teammates and visitors.  Children ages 23 and under may not visit. This policy affects the following departments only:  [facility] area Mother Baby Unit Newborn nursery/Special care nursery  Other areas: Masks continue to be strongly recommended for [facility] teammates, visitors and patients in all other areas. Visitation is not restricted outside of the units listed above.   How to Use an Incentive Spirometer An incentive spirometer is a tool that measures how well you are filling your lungs with each breath. Learning to take long, deep breaths using this tool can help you keep your lungs clear and active. This may help to reverse or lessen your chance of developing breathing (pulmonary) problems, especially infection. You may be asked to use a  spirometer: After a surgery. If you have a lung problem or a history of smoking. After a long period of time when you have been unable to move or be active. If the spirometer includes an indicator to show the highest number that you have reached, your health care provider or respiratory therapist will help you set a goal. Keep a log of your progress as told by your health care provider. What are the risks? Breathing too quickly may cause dizziness or cause you to pass out. Take your time so you do not get dizzy or light-headed. If you are in pain, you may need to take pain medicine before doing incentive spirometry. It is harder to take a deep breath if you are having pain. How to use your incentive spirometer  Sit up on the edge of your bed or on a chair. Hold the incentive spirometer so that it is in an upright position. Before you use the spirometer, breathe out normally. Place the mouthpiece in your mouth. Make sure your lips are closed tightly around it. Breathe in slowly and as deeply as you can through your mouth, causing the piston or the ball to rise toward  the top of the chamber. Hold your breath for 3-5 seconds, or for as long as possible. If the spirometer includes a coach indicator, use this to guide you in breathing. Slow down your breathing if the indicator goes above the marked areas. Remove the mouthpiece from your mouth and breathe out normally. The piston or ball will return to the bottom of the chamber. Rest for a few seconds, then repeat the steps 10 or more times. Take your time and take a few normal breaths between deep breaths so that you do not get dizzy or light-headed. Do this every 1-2 hours when you are awake. If the spirometer includes a goal marker to show the highest number you have reached (best effort), use this as a goal to work toward during each repetition. After each set of 10 deep breaths, cough a few times. This will help to make sure that your lungs are  clear. If you have an incision on your chest or abdomen from surgery, place a pillow or a rolled-up towel firmly against the incision when you cough. This can help to reduce pain while taking deep breaths and coughing. General tips When you are able to get out of bed: Walk around often. Continue to take deep breaths and cough in order to clear your lungs. Keep using the incentive spirometer until your health care provider says it is okay to stop using it. If you have been in the hospital, you may be told to keep using the spirometer at home. Contact a health care provider if: You are having difficulty using the spirometer. You have trouble using the spirometer as often as instructed. Your pain medicine is not giving enough relief for you to use the spirometer as told. You have a fever. Get help right away if: You develop shortness of breath. You develop a cough with bloody mucus from the lungs. You have fluid or blood coming from an incision site after you cough. Summary An incentive spirometer is a tool that can help you learn to take long, deep breaths to keep your lungs clear and active. You may be asked to use a spirometer after a surgery, if you have a lung problem or a history of smoking, or if you have been inactive for a long period of time. Use your incentive spirometer as instructed every 1-2 hours while you are awake. If you have an incision on your chest or abdomen, place a pillow or a rolled-up towel firmly against your incision when you cough. This will help to reduce pain. Get help right away if you have shortness of breath, you cough up bloody mucus, or blood comes from your incision when you cough. This information is not intended to replace advice given to you by your health care provider. Make sure you discuss any questions you have with your health care provider. Document Revised: 11/14/2019 Document Reviewed: 11/14/2019 Elsevier Patient Education  2023 [employer].

## 2022-08-13 MED ORDER — DEXAMETHASONE SODIUM PHOSPHATE 10 MG/ML IJ SOLN
8.0000 mg | Freq: Once | INTRAMUSCULAR | Status: AC
  Administered 2022-08-14: 10 mg via INTRAVENOUS

## 2022-08-13 MED ORDER — FAMOTIDINE 20 MG PO TABS: 20.0000 mg | ORAL_TABLET | Freq: Once | ORAL | Status: AC

## 2022-08-13 MED ORDER — LACTATED RINGERS IV SOLN: INTRAVENOUS | Status: DC

## 2022-08-13 MED ORDER — CHLORHEXIDINE GLUCONATE 0.12 % MT SOLN: 15.0000 mL | Freq: Once | OROMUCOSAL | Status: AC

## 2022-08-13 MED ORDER — ORAL CARE MOUTH RINSE: 15.0000 mL | Freq: Once | OROMUCOSAL | Status: AC

## 2022-08-13 MED ORDER — TRANEXAMIC ACID-NACL 1000-0.7 MG/100ML-% IV SOLN
1000.0000 mg | INTRAVENOUS | Status: AC
  Administered 2022-08-14 (×2): 1000 mg via INTRAVENOUS

## 2022-08-13 MED ORDER — CEFAZOLIN SODIUM-DEXTROSE 2-4 GM/100ML-% IV SOLN
2.0000 g | INTRAVENOUS | Status: AC
  Administered 2022-08-14: 2 g via INTRAVENOUS

## 2022-08-14 ENCOUNTER — Ambulatory Visit: Payer: BC Managed Care – PPO | Admitting: Urgent Care

## 2022-08-14 ENCOUNTER — Other Ambulatory Visit: Payer: Self-pay

## 2022-08-14 ENCOUNTER — Observation Stay
Admission: RE | Admit: 2022-08-14 | Discharge: 2022-08-15 | Disposition: A | Discharge status: Home Health | Payer: BC Managed Care – PPO | Attending: Orthopedic Surgery | Admitting: Orthopedic Surgery

## 2022-08-14 ENCOUNTER — Encounter
Admission: RE | Disposition: A | Discharge status: Home Health | Payer: Self-pay | Source: Home / Self Care | Attending: Orthopedic Surgery

## 2022-08-14 ENCOUNTER — Encounter: Payer: Self-pay | Admitting: Orthopedic Surgery

## 2022-08-14 ENCOUNTER — Observation Stay: Payer: BC Managed Care – PPO

## 2022-08-14 ENCOUNTER — Ambulatory Visit: Payer: BC Managed Care – PPO

## 2022-08-14 DIAGNOSIS — I10 Essential (primary) hypertension: Secondary | ICD-10-CM | POA: Diagnosis not present

## 2022-08-14 DIAGNOSIS — Z01818 Encounter for other preprocedural examination: Secondary | ICD-10-CM

## 2022-08-14 DIAGNOSIS — Z79899 Other long term (current) drug therapy: Secondary | ICD-10-CM | POA: Insufficient documentation

## 2022-08-14 DIAGNOSIS — M1711 Unilateral primary osteoarthritis, right knee: Principal | ICD-10-CM | POA: Insufficient documentation

## 2022-08-14 DIAGNOSIS — Z96651 Presence of right artificial knee joint: Secondary | ICD-10-CM

## 2022-08-14 HISTORY — PX: TOTAL KNEE ARTHROPLASTY: SHX125

## 2022-08-14 LAB — POCT PREGNANCY, URINE: Preg Test, Ur: NEGATIVE

## 2022-08-14 LAB — ABO/RH: ABO/RH(D): O POS

## 2022-08-14 SURGERY — ARTHROPLASTY, KNEE, TOTAL
Anesthesia: Spinal | Site: Knee | Laterality: Right

## 2022-08-14 MED ORDER — ACETAMINOPHEN 500 MG PO TABS
1000.0000 mg | ORAL_TABLET | Freq: Three times a day (TID) | ORAL | Status: DC
  Administered 2022-08-15: 1000 mg via ORAL
  Filled 2022-08-14: qty 2

## 2022-08-14 MED ORDER — MORPHINE SULFATE (PF) 4 MG/ML IV SOLN: 0.5000 mg | INTRAVENOUS | Status: DC | PRN

## 2022-08-14 MED ORDER — CEFAZOLIN SODIUM-DEXTROSE 2-4 GM/100ML-% IV SOLN
INTRAVENOUS | Status: AC
  Filled 2022-08-14: qty 100

## 2022-08-14 MED ORDER — PROPOFOL 1000 MG/100ML IV EMUL
INTRAVENOUS | Status: AC
  Filled 2022-08-14: qty 100

## 2022-08-14 MED ORDER — PHENYLEPHRINE HCL (PRESSORS) 10 MG/ML IV SOLN
INTRAVENOUS | Status: AC
  Filled 2022-08-14: qty 1

## 2022-08-14 MED ORDER — MIDAZOLAM HCL 2 MG/2ML IJ SOLN
INTRAMUSCULAR | Status: AC
  Filled 2022-08-14: qty 2

## 2022-08-14 MED ORDER — PHENYLEPHRINE HCL-NACL 20-0.9 MG/250ML-% IV SOLN
INTRAVENOUS | Status: DC | PRN
  Administered 2022-08-14: 20 ug/min via INTRAVENOUS

## 2022-08-14 MED ORDER — KETAMINE HCL 10 MG/ML IJ SOLN
INTRAMUSCULAR | Status: DC | PRN
  Administered 2022-08-14: 20 mg via INTRAVENOUS

## 2022-08-14 MED ORDER — IRRISEPT - 450ML BOTTLE WITH 0.05% CHG IN STERILE WATER, USP 99.95% OPTIME
TOPICAL | Status: DC | PRN
  Administered 2022-08-14: 450 mL

## 2022-08-14 MED ORDER — ONDANSETRON HCL 4 MG/2ML IJ SOLN: 4.0000 mg | Freq: Four times a day (QID) | INTRAMUSCULAR | Status: DC | PRN

## 2022-08-14 MED ORDER — ENOXAPARIN SODIUM 30 MG/0.3ML IJ SOSY
30.0000 mg | PREFILLED_SYRINGE | Freq: Two times a day (BID) | INTRAMUSCULAR | Status: DC
  Administered 2022-08-15: 30 mg via SUBCUTANEOUS
  Filled 2022-08-14: qty 0.3

## 2022-08-14 MED ORDER — SODIUM CHLORIDE (PF) 0.9 % IJ SOLN
INTRAMUSCULAR | Status: DC | PRN
  Administered 2022-08-14: 71 mL via INTRAMUSCULAR

## 2022-08-14 MED ORDER — OXYCODONE HCL 5 MG/5ML PO SOLN: 5.0000 mg | Freq: Once | ORAL | Status: DC | PRN

## 2022-08-14 MED ORDER — OXYCODONE HCL 5 MG PO TABS: 5.0000 mg | ORAL_TABLET | Freq: Once | ORAL | Status: DC | PRN

## 2022-08-14 MED ORDER — ATENOLOL 25 MG PO TABS
50.0000 mg | ORAL_TABLET | Freq: Every day | ORAL | Status: DC
  Administered 2022-08-15: 50 mg via ORAL
  Filled 2022-08-14: qty 2

## 2022-08-14 MED ORDER — KETOROLAC TROMETHAMINE 15 MG/ML IJ SOLN
7.5000 mg | Freq: Four times a day (QID) | INTRAMUSCULAR | Status: AC
  Administered 2022-08-14 – 2022-08-15 (×4): 7.5 mg via INTRAVENOUS
  Filled 2022-08-14 (×3): qty 1

## 2022-08-14 MED ORDER — DIPHENHYDRAMINE HCL 50 MG/ML IJ SOLN
INTRAMUSCULAR | Status: AC
  Filled 2022-08-14: qty 1

## 2022-08-14 MED ORDER — SODIUM CHLORIDE 0.9 % IR SOLN
Status: DC | PRN
  Administered 2022-08-14: 3000 mL

## 2022-08-14 MED ORDER — KETOROLAC TROMETHAMINE 15 MG/ML IJ SOLN
INTRAMUSCULAR | Status: AC
  Filled 2022-08-14: qty 1

## 2022-08-14 MED ORDER — FAMOTIDINE 20 MG PO TABS
ORAL_TABLET | ORAL | Status: AC
  Administered 2022-08-14: 20 mg via ORAL
  Filled 2022-08-14: qty 1

## 2022-08-14 MED ORDER — PHENOL 1.4 % MT LIQD: 1.0000 | OROMUCOSAL | Status: DC | PRN

## 2022-08-14 MED ORDER — DEXMEDETOMIDINE HCL IN NACL 80 MCG/20ML IV SOLN
INTRAVENOUS | Status: DC | PRN
  Administered 2022-08-14 (×3): 4 ug via BUCCAL

## 2022-08-14 MED ORDER — MENTHOL 3 MG MT LOZG: 1.0000 | LOZENGE | OROMUCOSAL | Status: DC | PRN

## 2022-08-14 MED ORDER — GLYCOPYRROLATE 0.2 MG/ML IJ SOLN
INTRAMUSCULAR | Status: AC
  Filled 2022-08-14: qty 1

## 2022-08-14 MED ORDER — ONDANSETRON HCL 4 MG/2ML IJ SOLN
INTRAMUSCULAR | Status: DC | PRN
  Administered 2022-08-14: 4 mg via INTRAVENOUS

## 2022-08-14 MED ORDER — TRAMADOL HCL 50 MG PO TABS
50.0000 mg | ORAL_TABLET | Freq: Four times a day (QID) | ORAL | Status: DC | PRN
  Administered 2022-08-15: 50 mg via ORAL
  Filled 2022-08-14: qty 1

## 2022-08-14 MED ORDER — HYDROMORPHONE HCL 1 MG/ML IJ SOLN: 0.2500 mg | INTRAMUSCULAR | Status: DC | PRN

## 2022-08-14 MED ORDER — GLYCOPYRROLATE PF 0.2 MG/ML IJ SOSY
PREFILLED_SYRINGE | INTRAMUSCULAR | Status: DC | PRN
  Administered 2022-08-14 (×2): .1 mg via INTRAVENOUS

## 2022-08-14 MED ORDER — ONDANSETRON HCL 4 MG PO TABS: 4.0000 mg | ORAL_TABLET | Freq: Four times a day (QID) | ORAL | Status: DC | PRN

## 2022-08-14 MED ORDER — DEXAMETHASONE SODIUM PHOSPHATE 10 MG/ML IJ SOLN
INTRAMUSCULAR | Status: AC
  Filled 2022-08-14: qty 1

## 2022-08-14 MED ORDER — HYDROCODONE-ACETAMINOPHEN 5-325 MG PO TABS
1.0000 | ORAL_TABLET | ORAL | Status: DC | PRN
  Administered 2022-08-14 (×2): 2 via ORAL
  Filled 2022-08-14 (×2): qty 2

## 2022-08-14 MED ORDER — KETOROLAC TROMETHAMINE 30 MG/ML IJ SOLN
INTRAMUSCULAR | Status: AC
  Filled 2022-08-14: qty 1

## 2022-08-14 MED ORDER — CEFAZOLIN SODIUM-DEXTROSE 2-4 GM/100ML-% IV SOLN
2.0000 g | Freq: Four times a day (QID) | INTRAVENOUS | Status: AC
  Administered 2022-08-14 (×2): 2 g via INTRAVENOUS
  Filled 2022-08-14 (×2): qty 100

## 2022-08-14 MED ORDER — DIPHENHYDRAMINE HCL 50 MG/ML IJ SOLN
INTRAMUSCULAR | Status: DC | PRN
  Administered 2022-08-14: 12.5 mg via INTRAVENOUS

## 2022-08-14 MED ORDER — MIDAZOLAM HCL 5 MG/5ML IJ SOLN
INTRAMUSCULAR | Status: DC | PRN
  Administered 2022-08-14 (×2): 1 mg via INTRAVENOUS

## 2022-08-14 MED ORDER — PHENYLEPHRINE HCL (PRESSORS) 10 MG/ML IV SOLN
INTRAVENOUS | Status: DC | PRN
  Administered 2022-08-14: 80 ug via INTRAVENOUS

## 2022-08-14 MED ORDER — TRANEXAMIC ACID-NACL 1000-0.7 MG/100ML-% IV SOLN
INTRAVENOUS | Status: AC
  Filled 2022-08-14: qty 100

## 2022-08-14 MED ORDER — DIPHENHYDRAMINE HCL 25 MG PO CAPS
25.0000 mg | ORAL_CAPSULE | Freq: Four times a day (QID) | ORAL | Status: DC | PRN
  Administered 2022-08-14 (×2): 25 mg via ORAL
  Filled 2022-08-14 (×2): qty 1

## 2022-08-14 MED ORDER — PROPOFOL 10 MG/ML IV BOLUS
INTRAVENOUS | Status: AC
  Filled 2022-08-14: qty 20

## 2022-08-14 MED ORDER — PAROXETINE HCL 20 MG PO TABS
20.0000 mg | ORAL_TABLET | Freq: Every day | ORAL | Status: DC
  Administered 2022-08-15: 20 mg via ORAL
  Filled 2022-08-14: qty 1

## 2022-08-14 MED ORDER — DOCUSATE SODIUM 100 MG PO CAPS
100.0000 mg | ORAL_CAPSULE | Freq: Two times a day (BID) | ORAL | Status: DC
  Administered 2022-08-14 – 2022-08-15 (×2): 100 mg via ORAL
  Filled 2022-08-14 (×2): qty 1

## 2022-08-14 MED ORDER — PANTOPRAZOLE SODIUM 40 MG PO TBEC
40.0000 mg | DELAYED_RELEASE_TABLET | Freq: Every day | ORAL | Status: DC
  Administered 2022-08-15: 40 mg via ORAL
  Filled 2022-08-14: qty 1

## 2022-08-14 MED ORDER — BUPIVACAINE HCL (PF) 0.5 % IJ SOLN
INTRAMUSCULAR | Status: DC | PRN
  Administered 2022-08-14: 2.8 mL via INTRATHECAL

## 2022-08-14 MED ORDER — 0.9 % SODIUM CHLORIDE (POUR BTL) OPTIME
TOPICAL | Status: DC | PRN
  Administered 2022-08-14: 500 mL

## 2022-08-14 MED ORDER — METOCLOPRAMIDE HCL 5 MG PO TABS
5.0000 mg | ORAL_TABLET | Freq: Three times a day (TID) | ORAL | Status: DC | PRN
  Filled 2022-08-14: qty 2

## 2022-08-14 MED ORDER — PROPOFOL 500 MG/50ML IV EMUL
INTRAVENOUS | Status: DC | PRN
  Administered 2022-08-14: 50 ug/kg/min via INTRAVENOUS

## 2022-08-14 MED ORDER — ACETAMINOPHEN 500 MG PO TABS: 1000.0000 mg | ORAL_TABLET | Freq: Three times a day (TID) | ORAL | Status: DC

## 2022-08-14 MED ORDER — KETAMINE HCL 50 MG/5ML IJ SOSY
PREFILLED_SYRINGE | INTRAMUSCULAR | Status: AC
  Filled 2022-08-14: qty 5

## 2022-08-14 MED ORDER — DIPHENHYDRAMINE HCL 25 MG PO CAPS: 25.0000 mg | ORAL_CAPSULE | Freq: Once | ORAL | Status: DC | PRN

## 2022-08-14 MED ORDER — METOCLOPRAMIDE HCL 5 MG/ML IJ SOLN: 5.0000 mg | Freq: Three times a day (TID) | INTRAMUSCULAR | Status: DC | PRN

## 2022-08-14 MED ORDER — LIDOCAINE HCL (CARDIAC) PF 100 MG/5ML IV SOSY
PREFILLED_SYRINGE | INTRAVENOUS | Status: DC | PRN
  Administered 2022-08-14: 40 mg via INTRAVENOUS

## 2022-08-14 MED ORDER — SODIUM CHLORIDE 0.9 % IV SOLN: INTRAVENOUS | Status: DC

## 2022-08-14 MED ORDER — CHLORHEXIDINE GLUCONATE 0.12 % MT SOLN
OROMUCOSAL | Status: AC
  Administered 2022-08-14: 15 mL via OROMUCOSAL
  Filled 2022-08-14: qty 15

## 2022-08-14 SURGICAL SUPPLY — 81 items
BLADE PATELLA REAM PILOT HOLE (MISCELLANEOUS) IMPLANT
BLADE SAW SAG 25X90X1.19 (BLADE) ×1 IMPLANT
BLADE SAW SAG 29X58X.64 (BLADE) ×1 IMPLANT
BLADE SAW SGTL 13X75X1.27 (BLADE) IMPLANT
BNDG ELASTIC 6X5.8 VLCR STR LF (GAUZE/BANDAGES/DRESSINGS) ×1 IMPLANT
BOWL CEMENT MIX W/ADAPTER (MISCELLANEOUS) ×1 IMPLANT
CEMENT BONE R 1X40 (Cement) ×2 IMPLANT
CHLORAPREP W/TINT 26 (MISCELLANEOUS) ×2 IMPLANT
COOLER POLAR GLACIER W/PUMP (MISCELLANEOUS) ×1 IMPLANT
CUFF TOURN SGL QUICK 24 (TOURNIQUET CUFF)
CUFF TOURN SGL QUICK 34 (TOURNIQUET CUFF)
CUFF TRNQT CYL 24X4X16.5-23 (TOURNIQUET CUFF) IMPLANT
CUFF TRNQT CYL 34X4.125X (TOURNIQUET CUFF) IMPLANT
DERMABOND ADVANCED .7 DNX12 (GAUZE/BANDAGES/DRESSINGS) ×1 IMPLANT
DRAPE 3/4 80X56 (DRAPES) ×1 IMPLANT
DRAPE INCISE IOBAN 66X60 STRL (DRAPES) ×1 IMPLANT
DRSG MEPILEX SACRM 8.7X9.8 (GAUZE/BANDAGES/DRESSINGS) ×1 IMPLANT
DRSG OPSITE POSTOP 4X10 (GAUZE/BANDAGES/DRESSINGS) IMPLANT
DRSG OPSITE POSTOP 4X8 (GAUZE/BANDAGES/DRESSINGS) IMPLANT
ELECT CAUTERY BLADE 6.4 (BLADE) ×1 IMPLANT
ELECT REM PT RETURN 9FT ADLT (ELECTROSURGICAL) ×1
ELECTRODE REM PT RTRN 9FT ADLT (ELECTROSURGICAL) ×1 IMPLANT
FEMUR  CMT CCR STD SZ7 R KNEE (Knees) ×1 IMPLANT
FEMUR CMT CCR STD SZ7 R KNEE (Knees) ×1 IMPLANT
FEMUR CMTD CCR STD SZ7 R KNEE (Knees) IMPLANT
GLOVE BIO SURGEON STRL SZ8 (GLOVE) ×1 IMPLANT
GLOVE BIOGEL PI IND STRL 8 (GLOVE) ×1 IMPLANT
GLOVE PI ORTHO PRO STRL 7.5 (GLOVE) ×2 IMPLANT
GLOVE PI ORTHO PRO STRL SZ8 (GLOVE) ×2 IMPLANT
GOWN STRL REUS W/ TWL LRG LVL3 (GOWN DISPOSABLE) ×1 IMPLANT
GOWN STRL REUS W/ TWL XL LVL3 (GOWN DISPOSABLE) ×2 IMPLANT
GOWN STRL REUS W/TWL LRG LVL3 (GOWN DISPOSABLE) ×1
GOWN STRL REUS W/TWL XL LVL3 (GOWN DISPOSABLE) ×2
HDLS TROCR DRIL PIN KNEE 75 (PIN) ×1
HOLDER FOLEY CATH W/STRAP (MISCELLANEOUS) ×1 IMPLANT
HOOD PEEL AWAY T7 (MISCELLANEOUS) ×2 IMPLANT
IV NS IRRIG 3000ML ARTHROMATIC (IV SOLUTION) ×1 IMPLANT
JET LAVAGE IRRISEPT WOUND (IRRIGATION / IRRIGATOR) ×1
KIT TURNOVER KIT A (KITS) ×1 IMPLANT
LAVAGE JET IRRISEPT WOUND (IRRIGATION / IRRIGATOR) IMPLANT
MANIFOLD NEPTUNE II (INSTRUMENTS) ×1 IMPLANT
MARKER SKIN DUAL TIP RULER LAB (MISCELLANEOUS) ×1 IMPLANT
MAT ABSORB  FLUID 56X50 GRAY (MISCELLANEOUS) ×1
MAT ABSORB FLUID 56X50 GRAY (MISCELLANEOUS) ×1 IMPLANT
NDL HYPO 21X1.5 SAFETY (NEEDLE) ×1 IMPLANT
NDL SAFETY ECLIP 18X1.5 (MISCELLANEOUS) ×1 IMPLANT
NEEDLE HYPO 21X1.5 SAFETY (NEEDLE) ×1 IMPLANT
NS IRRIG 500ML POUR BTL (IV SOLUTION) ×1 IMPLANT
PACK TOTAL KNEE (MISCELLANEOUS) ×1 IMPLANT
PAD WRAPON POLAR KNEE (MISCELLANEOUS) ×1 IMPLANT
PENCIL SMOKE EVACUATOR (MISCELLANEOUS) ×1 IMPLANT
PIN DRILL HDLS TROCAR 75 4PK (PIN) IMPLANT
PSN ASF PS VE R 6-9 KNEE 10 (Orthopedic Implant) ×1 IMPLANT
PULSAVAC PLUS IRRIG FAN TIP (DISPOSABLE) ×1
SCREW FEMALE HEX FIX 25X2.5 (ORTHOPEDIC DISPOSABLE SUPPLIES) IMPLANT
SCREW HEX HEADED 3.5X27 DISP (ORTHOPEDIC DISPOSABLE SUPPLIES) IMPLANT
SLEEVE SCD COMPRESS KNEE MED (STOCKING) ×1 IMPLANT
SOLUTION IRRIG SURGIPHOR (IV SOLUTION) ×1 IMPLANT
STEM POLY PAT PLY 32M KNEE (Knees) IMPLANT
STEM TIB ST PERS 14+30 (Stem) IMPLANT
STEM TIBIA 5 DEG SZ E R KNEE (Knees) IMPLANT
SURFACE ARTC PRSNA 6-9 KNEE 10 (Orthopedic Implant) IMPLANT
SUT DVC 2 QUILL PDO  T11 36X36 (SUTURE) ×1
SUT DVC 2 QUILL PDO T11 36X36 (SUTURE) ×1 IMPLANT
SUT QUILL MONODERM 3-0 PS-2 (SUTURE) ×1 IMPLANT
SUT VIC AB 0 CT1 36 (SUTURE) ×1 IMPLANT
SUT VIC AB 2-0 CT2 27 (SUTURE) ×2 IMPLANT
SUT VICRYL 1-0 27IN ABS (SUTURE) ×1
SUTURE VICRYL 1-0 27IN ABS (SUTURE) ×1 IMPLANT
SYR 10ML LL (SYRINGE) ×1 IMPLANT
SYR 30ML LL (SYRINGE) ×2 IMPLANT
SYR 3ML LL SCALE MARK (SYRINGE) ×1 IMPLANT
TAPE CLOTH 3X10 WHT NS LF (GAUZE/BANDAGES/DRESSINGS) ×1 IMPLANT
TIBIA STEM 5 DEG SZ E R KNEE (Knees) ×1 IMPLANT
TIP FAN IRRIG PULSAVAC PLUS (DISPOSABLE) ×1 IMPLANT
TOWEL OR 17X26 4PK STRL BLUE (TOWEL DISPOSABLE) IMPLANT
TRAP FLUID SMOKE EVACUATOR (MISCELLANEOUS) ×1 IMPLANT
TRAY FOLEY SLVR 16FR LF STAT (SET/KITS/TRAYS/PACK) ×1 IMPLANT
TUBE KAMVAC SUCTION (TUBING) ×1 IMPLANT
WATER STERILE IRR 1000ML POUR (IV SOLUTION) ×1 IMPLANT
WRAPON POLAR PAD KNEE (MISCELLANEOUS) ×1

## 2022-08-14 NOTE — Anesthesia Postprocedure Evaluation (Signed)
Anesthesia Post Note  Patient: Kara Murray  Procedure(s) Performed: TOTAL KNEE ARTHROPLASTY (Right: Knee)  Patient location during evaluation: PACU Anesthesia Type: Combined General/Spinal Level of consciousness: awake and alert Pain management: pain level controlled Vital Signs Assessment: post-procedure vital signs reviewed and stable Respiratory status: spontaneous breathing, nonlabored ventilation, respiratory function stable and patient connected to nasal cannula oxygen Cardiovascular status: blood pressure returned to baseline and stable Postop Assessment: no apparent nausea or vomiting Anesthetic complications: no   No notable events documented.   Last Vitals:  Vitals:   08/14/22 1415 08/14/22 1424  BP: (!) 140/87   Pulse: 73 75  Resp: 17 13  Temp: (!) 36.2 C   SpO2: 100% 100%    Last Pain:  Vitals:   08/14/22 1415  TempSrc:   PainSc: Asleep                 Louie Boston

## 2022-08-14 NOTE — Transfer of Care (Signed)
Immediate Anesthesia Transfer of Care Note  Patient: Kara Murray  Procedure(s) Performed: TOTAL KNEE ARTHROPLASTY (Right: Knee)  Patient Location: PACU  Anesthesia Type:Spinal  Level of Consciousness: drowsy  Airway & Oxygen Therapy: Patient Spontanous Breathing and Patient connected to face mask oxygen  Post-op Assessment: Report given to RN and Post -op Vital signs reviewed and stable  Post vital signs: Reviewed and stable  Last Vitals:  Vitals Value Taken Time  BP 140/87 08/14/22 1415  Temp 35.9 1414  Pulse 78 08/14/22 1418  Resp 16 08/14/22 1418  SpO2 100 % 08/14/22 1418  Vitals shown include unvalidated device data.  Last Pain:  Vitals:   08/14/22 0935  TempSrc: Temporal  PainSc: 0-No pain         Complications: No notable events documented.

## 2022-08-14 NOTE — Anesthesia Preprocedure Evaluation (Signed)
Anesthesia Evaluation  Patient identified by MRN, date of birth, ID band Patient awake    Reviewed: Allergy & Precautions, NPO status , Patient's Chart, lab work & pertinent test results  History of Anesthesia Complications Negative for: history of anesthetic complications  Airway Mallampati: II  TM Distance: >3 FB Neck ROM: full    Dental  (+) Chipped   Pulmonary neg pulmonary ROS, former smoker   Pulmonary exam normal        Cardiovascular hypertension, On Medications and On Home Beta Blockers negative cardio ROS Normal cardiovascular exam     Neuro/Psych negative neurological ROS  negative psych ROS   GI/Hepatic negative GI ROS,,,(+)     substance abuse  alcohol use  Endo/Other  negative endocrine ROS    Renal/GU      Musculoskeletal  (+) Arthritis ,    Abdominal   Peds  Hematology negative hematology ROS (+) Sickle cell trait    Anesthesia Other Findings Past Medical History: No date: Abnormal LFTs No date: Alcohol abuse No date: Anemia No date: Arthritis No date: Gout No date: Hypertension No date: Hypokalemia No date: Hyponatremia No date: Marijuana abuse No date: Sickle cell trait [facility])  Past Surgical History: 09/08/2008: biariactric surgery 1994, 2001: KNEE SURGERY; Right 04/11/2014: THUMB FUSION; Left 09/08/2006: TOE SURGERY     Reproductive/Obstetrics negative OB ROS                             Anesthesia Physical Anesthesia Plan  ASA: 3  Anesthesia Plan: Spinal   Post-op Pain Management: Toradol IV (intra-op)*, Ofirmev IV (intra-op)* and Dilaudid IV   Induction:   PONV Risk Score and Plan: 2 and Propofol infusion, TIVA and Treatment may vary due to age or medical condition  Airway Management Planned: Natural Airway and Nasal Cannula  Additional Equipment:   Intra-op Plan:   Post-operative Plan:   Informed Consent: I have reviewed the patients  History and Physical, chart, labs and discussed the procedure including the risks, benefits and alternatives for the proposed anesthesia with the patient or authorized representative who has indicated his/her understanding and acceptance.     Dental Advisory Given  Plan Discussed with: Anesthesiologist, CRNA and Surgeon  Anesthesia Plan Comments: (Patient reports no bleeding problems and no anticoagulant use.   Patient consented for risks of anesthesia including but not limited to:  - adverse reactions to medications - risk of bleeding, infection and or nerve damage from epidural that could lead to paralysis - risk of headache or failed epidural - nerve damage due to positioning - that if epidural is used for C-section that there is a chance of epidural failure requiring spinal placement or conversion to GA - Damage to heart, brain, lungs, other parts of body or loss of life  Patient voiced understanding.)        Anesthesia Quick Evaluation

## 2022-08-14 NOTE — Anesthesia Procedure Notes (Signed)
Spinal  Patient location during procedure: OR Start time: 08/14/2022 11:20 AM End time: 08/14/2022 11:38 AM Reason for block: surgical anesthesia Staffing Performed: anesthesiologist  Anesthesiologist: Ilene Qua, MD Performed by: Cammie Sickle, CRNA Authorized by: Ilene Qua, MD   Preanesthetic Checklist Completed: patient identified, IV checked, site marked, risks and benefits discussed, surgical consent, monitors and equipment checked, pre-op evaluation and timeout performed Spinal Block Patient position: sitting Prep: ChloraPrep Patient monitoring: heart rate, continuous pulse ox and blood pressure Approach: midline Location: L4-5 Injection technique: single-shot Needle Needle type: Introducer and Pencan  Needle gauge: 24 G Needle length: 9 cm Assessment Events: CSF return Additional Notes Negative paresthesia. Negative blood return. Positive free-flowing CSF. Expiration date of kit checked and confirmed. Patient tolerated procedure well, without complications. I attempted twice unsuccessful hitting OS both times at  D4-X1 level, no complications noted. Dr. Danne Baxter was successful on second attempt at a level just below (L4-L5) without any complications. Pt. Was negative for paresthesia and blood return for the entirety of the spinal procedure. She tolerated well, without any pain/discomfort.

## 2022-08-14 NOTE — H&P (Signed)
History of Present Illness: The patient is an 46 y.o. female seen in clinic today for follow-up prior to right total knee replacement.  Patient reports she has had problems with her right knee for years.  She first injured her knee playing basketball in high school and then again playing softball in college and underwent 2 knee arthroscopies with lateral meniscus injury and cartilage damage.  She currently has pain every day and has been treating it with Tylenol.  She has pain at night.  She underwent weight loss surgery and lost a bunch of weight but then has gained it back she. reports grinding and locking in her knee.  She has undergone physical therapy multiple times.  At this point she has undergone conservative treatment and feels that her knee pain is still not managed .   The pain has become a functional disability for her affecting her ability to walk on a daily basis and do her activities of daily living.  The patient denies fevers, chills, numbness, tingling, shortness of breath, chest pain, recent illness, or any trauma.   Past Medical History:     Past Medical History:  Diagnosis Date   Knee pain        Past Surgical History:      Past Surgical History:  Procedure Laterality Date   BARIATRIC SURGERY   2010    [facility], at [place].   FOOT SURGERY        right   HALLUX VALGUS CORRECTION Right     KNEE SURGERY        right x 2      Past Family History: Family History       Family History  Problem Relation Age of Onset   Diabetes Mother     Diabetes Father     Breast cancer Maternal Aunt     Breast cancer Cousin         Medications:       Current Outpatient Medications Ordered in Epic  Medication Sig Dispense Refill   allopurinoL (ZYLOPRIM) 100 MG tablet Take 1 tablet by mouth once as needed       PARoxetine (PAXIL) 20 MG tablet Take one-half table daily for 4 days, then increase to 1 tablet daily if tolerating       acetaminophen (TYLENOL) 500 MG tablet Take by  mouth once daily       amLODIPine (NORVASC) 10 MG tablet Take 10 mg by mouth once daily       atenoloL (TENORMIN) 50 MG tablet Take 50 mg by mouth once daily       colchicine, gout, (COLCRYS) 0.6 mg tablet Take 2 tablets, and then 1 hour later take 1 tablet       levonorgestreL (MIRENA 52 MG) 20 mcg/24 hr (5 years) IUD Insert into the uterus        No current Epic-ordered facility-administered medications on file.      Allergies: No Known Allergies    Review of Systems:  A comprehensive 14 point ROS was performed, reviewed, and the pertinent orthopaedic findings are documented in the HPI.   Physical Exam: General/Constitutional: No apparent distress: well-nourished and well developed. Eyes: Pupils equal, round with synchronous movement.  Pulmonary exam: Lungs clear to auscultation bilaterally no wheezing rales or rhonchi Cardiac exam: Regular rate and rhythm no obvious murmurs rubs or gallops.   Integumentary: No impressive skin lesions present, except as noted in detailed exam. Neuro/Psych: Normal mood and affect, oriented to  person, place and time.   Comprehensive Knee Exam: Gait Antalgic  Alignment Right knee 10 degrees valgus    Inspection   Right Left  Skin Normal appearance with no obvious deformity.  No ecchymosis or erythema. Normal appearance with no obvious deformity.  No ecchymosis or erythema.  Soft Tissue No focal soft tissue swelling No focal soft tissue swelling  Quad Atrophy None None    Palpation    Right Left  Tenderness Medial joint line tenderness No peripatellar, patellar tendon, quad tendon, medial/lateral joint line pain  Crepitus Patellofemoral crepitus and tibio femoral crepitus No patellofemoral or tibiofemoral crepitus  Effusion None None    Range of Motion   Right Left  Flexion  10-90 0-115  Extension  Full knee extension without hyperextension Full knee extension without hyperextension    Ligamentous Exam   Right Left  Lachman Normal Normal   Valgus 0 Normal Normal  Valgus 30 Normal Normal  Varus 0 Normal Normal  Varus 30 Normal Normal  Anterior Drawer Normal Normal  Posterior Drawer Normal Normal    Meniscal Exam   Right Left  Hyperflexion Test Negative Negative  Hyperextension Test Negative Negative  McMurray's Positive Negative                  Neurovascular   Right Left  Quadriceps Strength 5/5 5/5  Hamstring Strength 5/5 5/5  Hip Abductor Strength 4/5 4/5  Distal Motor Normal Normal  Distal Sensory Normal light touch sensation Normal light touch sensation  Distal Pulses Normal Normal      Imaging Studies: I have reviewed AP, lateral,sunrise, and flexed PA weight bearing knee X-rays (4 views) of the right knee ordered and taken today in the office show severe degenerative arthritis with lateral joint space narrowing and osteophyte formation, subchondral cysts and sclerosis.  Right knee measuring 12 degrees of valgus on x-ray.  Left knee with AP and flexed PA views showing lateral joint space narrowing lateral tibial osteophyte mild to moderate osteoarthritic changes.    Assessment:  Right knee osteoarthritis   Plan: Pegge is a 46year old female who presents with right knee bone on bone arthritis. Based upon the patient's continued symptoms and failure to respond to conservative treatment, I have recommended a right total knee replacement for this patient. A long discussion took place with the patient describing what a total joint replacement is and what the procedure would entail. A knee model, similar to the implants that will be used during the operation, was utilized to demonstrate the implants. Choices of implant manufactures were discussed and reviewed. The ability to secure the implant utilizing cement or cementless (press fit) fixation was discussed. The approach and exposure was discussed.    The hospitalization and post-operative care and rehabilitation were also discussed. The use of perioperative  antibiotics and DVT prophylaxis were discussed. The risk, benefits and alternatives to a surgical intervention were discussed at length with the patient. The patient was also advised of risks related to the medical comorbidities and elevated body mass index (BMI). A lengthy discussion took place to review the most common complications including but not limited to: stiffness, loss of function, complex regional pain syndrome, deep vein thrombosis, pulmonary embolus, heart attack, stroke, infection, wound breakdown, numbness, damage to nerves, tendon,muscles, arteries or other blood vessels, death and other possible complications from anesthesia. The patient was told that we will take steps to minimize these risks by using sterile technique, antibiotics and DVT prophylaxis when appropriate and follow the  patient postoperatively in the office setting to monitor progress. The possibility of recurrent pain, no improvement in pain and actual worsening of pain were also discussed with the patient.  A specific conversation was had with the patient about her young age and the longevity of components.  Given her younger age there is a higher likelihood of her needing a revision to her knee replacement in the future and she understands this risk.  Additionally we discussed increased use and impact activities and their potential effect on longevity of implants.   The discharge plan of care focused on the patient going home following surgery. The patient was encouraged to make the necessary arrangements to have someone stay with them when they are discharged home.    The benefits of surgery were discussed with the patient including the potential for improving the patient's current clinical condition through operative intervention. Alternatives to surgical intervention including continued conservative management were also discussed in detail. All questions were answered to the satisfaction of the patient. The patient  participated and agreed to the plan of care as well as the use of the recommended implants for their total knee replacement surgery.   An information packet was given to the patient to review prior to surgery.  The patient received medical clearance for surgery.  All questions answered the patient agrees to the above plan.

## 2022-08-14 NOTE — Plan of Care (Signed)
  Problem: Education: Goal: Knowledge of the prescribed therapeutic regimen will improve Outcome: Progressing Goal: Individualized Educational Video(s) Outcome: Progressing   

## 2022-08-14 NOTE — Interval H&P Note (Signed)
History and Physical Interval Note:  08/14/2022 10:40 AM  Kara Murray  has presented today for surgery, with the diagnosis of Right knee pain, unspecified chronicity M25.561 Osteoarthritis of right knee, unspecified osteoarthritis type M17.11.  The various methods of treatment have been discussed with the patient and family. After consideration of risks, benefits and other options for treatment, the patient has consented to  Procedure(s): TOTAL KNEE ARTHROPLASTY (Right) as a surgical intervention.  The patient's history has been reviewed, patient examined, no change in status, stable for surgery.  I have reviewed the patient's chart and labs.  Questions were answered to the patient's satisfaction.     Reinaldo Berber

## 2022-08-14 NOTE — Op Note (Signed)
Patient Name: Kara Murray  KKX:[id number]  Pre-Operative Diagnosis: Right knee Osteoarthritis  Post-Operative Diagnosis: (same)  Procedure: Right Total Knee Arthroplasty  Components/Implants: Femur Persona  Size 7 PS   Tibia Persona Size E w 14x53mm stem  Poly 44mm PS  Patella 76mm x 8.38mm symmetric  Date of Surgery: 08/14/2022  Surgeon: Reinaldo Berber MD  Assistant: Amador Cunas (present and scrubbed throughout the case, critical for assistance with exposure, retraction, instrumentation, and closure)   Anesthesiologist: Joelene Millin  Anesthesia: Spinal  Tourniquet Time: 85 min  EBL: 75  IVF: 1500  Complications: None   Brief history: The patient is a 46 year old female with a history of osteoarthritis of the right knee with pain limiting their range of motion and activities of daily living, which has failed multiple attempts at conservative therapy.  The risks and benefits of total knee arthroplasty as definitive surgical treatment were discussed with the patient, who opted to proceed with the operation.  After outpatient medical clearance and optimization was completed the patient was admitted to [facility] for the procedure.  All preoperative films were reviewed and an appropriate surgical plan was made prior to surgery. Preoperative range of motion was 10 to 90 with a 10 fixed flexion contracture. The patient was identified as having a Valgus alignment.   Description of procedure: The patient was brought to the operating room where laterality was confirmed by all those present to be the right side.   Spinal anesthesia was administered and the patient received an intravenous dose of antibiotics for surgical prophylaxis and a dose of tranexamic acid.  Patient is positioned supine on the operating room table with all bony prominences well-padded.  A well-padded tourniquet was applied to the right thigh.  The knee was then prepped and draped in usual sterile  fashion with multiple layers of adhesive and nonadhesive drapes.  All of those present in the operating room participated in a surgical timeout laterality and patient were confirmed.   An Esmarch was wrapped around the extremity and the leg was elevated and the knee flexed.  The tourniquet was inflated to a pressure of 275 mmHg. The Esmarch was removed and the leg was brought down to full extension.  The patella and tibial tubercle identified and outlined using a marking pen and a midline skin incision was made with a knife carried through the subcutaneous tissue down to the extensor retinaculum.  After exposure of the extensor mechanism the medial parapatellar arthrotomy was performed with a scalpel and electrocautery extending down medial and distal to the tibial tubercle taking care to avoid incising the patellar tendon.   A standard medial release was performed over the proximal tibia.  The knee was brought into extension in order to excise the fat pad taking care not to damage the patella tendon.  The superior soft tissue was removed from the anterior surface of the distal femur to visualize for the procedure.  The knee was then brought into flexion with the patella subluxed laterally and subluxing the tibia anteriorly.  The ACL and PCL were transected and removed with electrocautery and additional soft tissue was removed from the proximal surface of the tibia to fully expose.   An extramedullary tibial cutting guide was then applied to the leg with a spring-loaded ankle clamp placed around the distal tibia just above the malleoli the angulation of the guide was adjusted to give some posterior slope in the tibial resection with a neutral varus valgus alignment.  The resection guide  was then pinned to the proximal tibia and the proximal tibial surface was resected with an oscillating saw.  Careful attention was paid to ensure the blade did not disrupt any of the soft tissues including any lateral or medial  ligament.  Attention was then turned to the femur, with the knee slightly flexed a opening drill was used to enter the medullary canal of the femur.  After removing the drill marrow was suctioned out to decompress the distal femur.  An intramedullary femoral guide was then inserted into the drill hole and the alignment guide was seated firmly against the distal end of the medial femoral condyle.  The distal femoral cutting guide was then attached and pinned securely to the anterior surface of the femur and the intramedullary rod and alignment guide was removed.  Distal femur resection was then performed with an oscillating saw with retractors protecting medial and laterally.   The distal cutting block was then removed and the extension gap was checked with a spacer.  Extension gap was found to be appropriately sized to accommodate the spacer block. The spacer block was found to be tight and did not allow full extension so the femoral cutting guide was reattached and additional 2 mm of distal femur were resected.   The femoral sizing guide was then placed securely into the posterior condyles of the femur and the femoral size was measured and determined to be 7.  The size 7; 4-in-1 cutting guide was placed in position and secured with 2 pins.  The anterior posterior and chamfer resections were then performed with an oscillating saw.  Bony fragments and osteophytes were then removed.  Using a lamina spreader the posterior medial and lateral condyles were checked for additional osteophytes and posterior soft tissue remnants.  Any remaining meniscus was removed at this time.  Periarticular injection was performed in the meniscal rims and posterior capsule with aspiration performed to ensure no intravascular injection.   The tibia was then exposed and the tibial trial was pinned onto the plateau after confirming appropriate orientation and rotation.  Using the drill bushing the tibia was prepared to the  appropriate drill depth.  Tibial broach impactor was then driven through the punch guide using a mallet.  The femoral trial component was then inserted onto the femur. The femoral box was then cut with an oscillating saw.  A trial tibial polyethylene bearing was then placed and the knee was reduced.  The knee achieved full extension with no hyperextension and was found to be balanced in flexion and extension with the trials in place.  The knee was then brought into full extension the patella was everted and held with 2 Kocher clamps.  The articular surface of the patella was then resected with an patella reamer and saw after careful measurement with a caliper.  The patella was then prepared with the drill guide and a trial patella was placed.  The knee was then taken through range of motion and it was found that the patella circulated appropriately with the trochlea and good patellofemoral motion without subluxation.    The correct final components for implantation were confirmed and opened by the circulator nurse.  The prepared surfaces of the patella femur and tibia were cleaned with pulsatile lavage to remove all blood fat and other material and then the surfaces were dried.  2 bags of cement were mixed under vacuum and the components were cemented into place.  Excess cement was removed with curettes and forceps.  The final polyethylene tibial component was implanted and the knee was brought into full extension to allow the cement to set.  At this time the periarticular injection cocktail was placed in the soft tissues surrounding the knee.  The knee was then irrigated with copious amount of normal saline via pulsatile lavage to remove all loose bodies and other debris.  The knee was then irrigated with surgiphor betadine based wash and reirrigated with saline.  The tourniquet was then dropped and all bleeding vessels were identified and coagulated.  The arthrotomy was approximated with #1 Vicryl and closed  with #2 Quill suture.  The knee was brought into slight flexion and the subcutaneous tissues were closed with 0 Vicryl, 2-0 Vicryl and a running subcuticular 3-0 monoderm quil suture.  Skin was then glued with Dermabond.  A sterile adhesive dressing was then placed along with a sequential compression device to the calf, a Ted stocking, and a cryotherapy cuff.   Sponge, needle, and Lap counts were all correct at the end of the case.   The patient was transferred off of the operating room table to a hospital bed, good pulses were found distally on the operative side.  The patient was transferred to the recovery room in stable condition.

## 2022-08-14 NOTE — Anesthesia Procedure Notes (Signed)
Spinal  Patient location during procedure: OR Start time: 08/14/2022 11:22 AM End time: 08/14/2022 11:38 AM Reason for block: surgical anesthesia Staffing Performed: anesthesiologist  Anesthesiologist: Ilene Qua, MD Performed by: Cammie Sickle, CRNA Authorized by: Ilene Qua, MD   Preanesthetic Checklist Completed: patient identified, IV checked, site marked, risks and benefits discussed, surgical consent, monitors and equipment checked, pre-op evaluation and timeout performed Spinal Block Patient position: sitting Prep: ChloraPrep Patient monitoring: heart rate, continuous pulse ox and blood pressure Approach: midline Location: L4-5 Injection technique: single-shot Needle Needle type: Introducer and Pencan  Needle gauge: 24 G Needle length: 9 cm Assessment Sensory level: T4 Events: CSF return Additional Notes Negative paresthesia. Negative blood return. Positive free-flowing CSF. Expiration date of kit checked and confirmed. Patient tolerated procedure well, without complications.

## 2022-08-15 ENCOUNTER — Encounter: Payer: Self-pay | Admitting: Orthopedic Surgery

## 2022-08-15 DIAGNOSIS — M1711 Unilateral primary osteoarthritis, right knee: Secondary | ICD-10-CM | POA: Diagnosis not present

## 2022-08-15 LAB — CBC
HCT: 26.8 % — ABNORMAL LOW (ref 36.0–46.0)
Hemoglobin: 8.8 g/dL — ABNORMAL LOW (ref 12.0–15.0)
MCH: 27.2 pg (ref 26.0–34.0)
MCHC: 32.8 g/dL (ref 30.0–36.0)
MCV: 83 fL (ref 80.0–100.0)
Platelets: 334 10*3/uL (ref 150–400)
RBC: 3.23 MIL/uL — ABNORMAL LOW (ref 3.87–5.11)
RDW: 19.7 % — ABNORMAL HIGH (ref 11.5–15.5)
WBC: 10.9 10*3/uL — ABNORMAL HIGH (ref 4.0–10.5)
nRBC: 0 % (ref 0.0–0.2)

## 2022-08-15 LAB — BASIC METABOLIC PANEL
Anion gap: 6 (ref 5–15)
BUN: <5 mg/dL — ABNORMAL LOW (ref 6–20)
CO2: 22 mmol/L (ref 22–32)
Calcium: 8.4 mg/dL — ABNORMAL LOW (ref 8.9–10.3)
Chloride: 108 mmol/L (ref 98–111)
Creatinine, Ser: 0.61 mg/dL (ref 0.44–1.00)
GFR, Estimated: >60 mL/min (ref >60)
Glucose, Bld: 118 mg/dL — ABNORMAL HIGH (ref 70–99)
Potassium: 3.7 mmol/L (ref 3.5–5.1)
Sodium: 136 mmol/L (ref 135–145)

## 2022-08-15 MED ORDER — ONDANSETRON HCL 4 MG PO TABS: 4.0000 mg | ORAL_TABLET | Freq: Four times a day (QID) | ORAL | 0 refills | Status: DC | PRN

## 2022-08-15 MED ORDER — CELECOXIB 200 MG PO CAPS: 200.0000 mg | ORAL_CAPSULE | Freq: Two times a day (BID) | ORAL | 0 refills | Status: AC

## 2022-08-15 MED ORDER — DOCUSATE SODIUM 100 MG PO CAPS: 100.0000 mg | ORAL_CAPSULE | Freq: Two times a day (BID) | ORAL | 0 refills | Status: DC

## 2022-08-15 MED ORDER — ENOXAPARIN SODIUM 40 MG/0.4ML IJ SOSY: 40.0000 mg | PREFILLED_SYRINGE | INTRAMUSCULAR | 0 refills | Status: DC

## 2022-08-15 MED ORDER — TRAMADOL HCL 50 MG PO TABS: 50.0000 mg | ORAL_TABLET | Freq: Four times a day (QID) | ORAL | 0 refills | Status: DC | PRN

## 2022-08-15 MED ORDER — ACETAMINOPHEN 500 MG PO TABS: 1000.0000 mg | ORAL_TABLET | Freq: Three times a day (TID) | ORAL | 0 refills | Status: DC

## 2022-08-15 NOTE — Plan of Care (Signed)

## 2022-08-15 NOTE — Evaluation (Signed)
Physical Therapy Evaluation Patient Details Name: Kara Murray MRN: [id number] DOB: 1976-07-29 Today's Date: 08/15/2022  History of Present Illness  46 y/o female s/p R TKA on 08/14/22. PMH: hx of gastric bypass  Clinical Impression  Patient admitted following above procedure. PTA, patient lives with mom and son and was independent. Patient presents with post op weakness, impaired R knee ROM, and decreased activity tolerance. Patient overall functioning at supervision level with RW. Negotiated 2 stairs with R handrail and supervision. Educated patient on knee precautions and HEP handout, patient verbalized understanding. Able to perform therex to promote LE strengthening with good technique. Patient will benefit from skilled PT services during acute stay to address listed deficits.      Recommendations for follow up therapy are one component of a multi-disciplinary discharge planning process, led by the attending physician.  Recommendations may be updated based on patient status, additional functional criteria and insurance authorization.  Follow Up Recommendations Follow physician's recommendations for discharge plan and follow up therapies      Assistance Recommended at Discharge PRN  Patient can return home with the following  A little help with bathing/dressing/bathroom;Assistance with cooking/housework;Help with stairs or ramp for entrance;Assist for transportation    Equipment Recommendations Rolling Chezney Huether (2 wheels);BSC/3in1  Recommendations for Other Services       Functional Status Assessment Patient has had a recent decline in their functional status and demonstrates the ability to make significant improvements in function in a reasonable and predictable amount of time.     Precautions / Restrictions Precautions Precautions: Knee Precaution Booklet Issued: Yes (comment) Restrictions Weight Bearing Restrictions: Yes RLE Weight Bearing: Weight bearing as tolerated       Mobility  Bed Mobility Overal bed mobility: Modified Independent                  Transfers Overall transfer level: Needs assistance Equipment used: Rolling Rajanee Schuelke (2 wheels) Transfers: Sit to/from Stand Sit to Stand: Supervision                Ambulation/Gait Ambulation/Gait assistance: Supervision Gait Distance (Feet): 200 Feet Assistive device: Rolling Oliva Montecalvo (2 wheels) Gait Pattern/deviations: Step-through pattern, Decreased stride length Gait velocity: decreased     General Gait Details: cues for heel strike on R to promote knee extension  Stairs Stairs: Yes Stairs assistance: Supervision Stair Management: One rail Right, Step to pattern, Forwards Number of Stairs: 2    Wheelchair Mobility    Modified Rankin (Stroke Patients Only)       Balance Overall balance assessment: Needs assistance Sitting-balance support: No upper extremity supported, Feet supported Sitting balance-Leahy Scale: Good     Standing balance support: Bilateral upper extremity supported, Reliant on assistive device for balance Standing balance-Leahy Scale: Poor Standing balance comment: reliant on RW for support to offweight R LE                             Pertinent Vitals/Pain Pain Assessment Pain Assessment: Faces Faces Pain Scale: Hurts little more Pain Location: R knee Pain Descriptors / Indicators: Discomfort, Grimacing Pain Intervention(s): Monitored during session, Repositioned    Home Living Family/patient expects to be discharged to:: Private residence Living Arrangements: Children;Parent Available Help at Discharge: Family Type of Home: House Home Access: Stairs to enter Entrance Stairs-Rails: Right Entrance Stairs-Number of Steps: 2   Home Layout: One level Home Equipment: None      Prior Function Prior Level of Function :  Independent/Modified Independent;Working/employed;Driving                     Hand Dominance         Extremity/Trunk Assessment   Upper Extremity Assessment Upper Extremity Assessment: Overall WFL for tasks assessed    Lower Extremity Assessment Lower Extremity Assessment: RLE deficits/detail RLE Deficits / Details: deficits consistent with post op pain and weakness. Able to complete SLR with <10 degree extension lag    Cervical / Trunk Assessment Cervical / Trunk Assessment: Normal  Communication   Communication: No difficulties  Cognition Arousal/Alertness: Awake/alert Behavior During Therapy: WFL for tasks assessed/performed Overall Cognitive Status: Within Functional Limits for tasks assessed                                          General Comments      Exercises Total Joint Exercises Ankle Circles/Pumps: AROM, Both, 10 reps Quad Sets: AROM, Right, 5 reps (long sitting) Heel Slides: AROM, Right, 5 reps (long sitting) Hip ABduction/ADduction: AROM, Right, 5 reps (long sitting) Straight Leg Raises: AROM, Right, 5 reps (long sitting) Long Arc Quad: AROM, Right, 5 reps (long sitting)   Assessment/Plan    PT Assessment Patient needs continued PT services  PT Problem List Decreased strength;Decreased range of motion;Decreased activity tolerance;Decreased balance;Decreased mobility;Decreased knowledge of precautions;Decreased knowledge of use of DME       PT Treatment Interventions DME instruction;Gait training;Stair training;Functional mobility training;Therapeutic activities;Therapeutic exercise;Balance training;Patient/family education    PT Goals (Current goals can be found in the Care Plan section)  Acute Rehab PT Goals Patient Stated Goal: to go home PT Goal Formulation: With patient Time For Goal Achievement: 08/29/22 Potential to Achieve Goals: Good    Frequency BID     Co-evaluation               AM-PAC PT "6 Clicks" Mobility  Outcome Measure Help needed turning from your back to your side while in a flat bed without using  bedrails?: None Help needed moving from lying on your back to sitting on the side of a flat bed without using bedrails?: None Help needed moving to and from a bed to a chair (including a wheelchair)?: A Little Help needed standing up from a chair using your arms (e.g., wheelchair or bedside chair)?: A Little Help needed to walk in hospital room?: A Little Help needed climbing 3-5 steps with a railing? : A Little 6 Click Score: 20    End of Session Equipment Utilized During Treatment: Gait belt Activity Tolerance: Patient tolerated treatment well Patient left: in chair;with call bell/phone within reach Nurse Communication: Mobility status PT Visit Diagnosis: Unsteadiness on feet (R26.81);Muscle weakness (generalized) (M62.81);Difficulty in walking, not elsewhere classified (R26.2)    Time: 0910-0950 PT Time Calculation (min) (ACUTE ONLY): 40 min   Charges:   PT Evaluation $PT Eval Low Complexity: 1 Low PT Treatments $Therapeutic Exercise: 8-22 mins $Therapeutic Activity: 8-22 mins        Collie Kittel A. Dan Humphreys PT, DPT [facility] Rehabilitation Services   Cereniti Curb A Dmiyah Liscano 08/15/2022, 12:29 PM

## 2022-08-15 NOTE — Progress Notes (Signed)
Patient discharging home. IV removed. Instructions given to patient and family, verbalized understanding. Waiting on equipment to be delivered.

## 2022-08-15 NOTE — Progress Notes (Signed)
Met with the patient at the bedside Family to transport 3 in 1 and RW to be delivered to the bedside by [facility], spoke with travis to arrange [facility] set up with [facility]

## 2022-08-15 NOTE — Discharge Summary (Signed)
Physician Discharge Summary  Patient ID: Kara Murray MRN: [id number] DOB/AGE: [birth year] 47 y.o.  Admit date: 08/14/2022 Discharge date: 08/15/2022  Admission Diagnoses:  S/P TKR (total knee replacement) using cement, right [Z96.651]   Discharge Diagnoses: Patient Active Problem List   Diagnosis Date Noted   S/P TKR (total knee replacement) using cement, right 08/14/2022   Electrolyte disturbance 07/09/2022   Insomnia 07/09/2022   Alcohol abuse 07/09/2022   Hyponatremia 07/09/2022   Hypokalemia 07/09/2022   Chronic diarrhea 07/09/2022   Abnormal LFTs 07/09/2022   Sickle cell trait (HCC) 01/20/2022   Elevated ferritin 01/20/2022   Hypertriglyceridemia 01/08/2022   History of anemia 01/08/2022   Family history of diabetes mellitus (DM) 01/08/2022   History of gout 01/08/2022   Osteoarthritis of knee 11/13/2017   Localized, primary osteoarthritis 11/13/2017   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 female who was admitted 08/14/2022 with a diagnosis of S/P TKR (total knee replacement) using cement, right and went to the operating room on 08/14/2022 and underwent the above named procedures.    Surgeries: Procedure(s): TOTAL KNEE ARTHROPLASTY on 08/14/2022 Patient tolerated the surgery well. Taken to PACU where she was stabilized and then transferred to the orthopedic floor.  Started on Lovenox 30 mg q 12 hrs. TEDs and SCDs applied bilaterally. Heels elevated on bed. No evidence of DVT. Negative Homan. Physical therapy started on day #1 for gait training and transfer. OT started day #1 for ADL and assisted  devices.  Patient's IV  was d/c on day #1. Patient was able to safely and independently complete all PT goals. PT recommending discharge to home.   On post op day #1 patient was stable and ready for discharge to home with HHPT.  Implants: Femur Persona  Size 7 PS   Tibia Persona Size E w 14x68mm stem  Poly 42mm PS  Patella 53mm x 8.6mm symmetric   She was given perioperative antibiotics:  Anti-infectives (From admission, onward)    Start     Dose/Rate Route Frequency Ordered Stop   08/14/22 1700  ceFAZolin (ANCEF) IVPB 2g/100 mL premix        2 g 200 mL/hr over 30 Minutes Intravenous Every 6 hours 08/14/22 1456 08/14/22 2351   08/14/22 0922  ceFAZolin (ANCEF) 2-4 GM/100ML-% IVPB       Note to Pharmacy: Mikey Bussing P: cabinet override      08/14/22 0922 08/14/22 1142   08/14/22 0600  ceFAZolin (ANCEF) IVPB 2g/100 mL premix        2 g 200 mL/hr over 30 Minutes Intravenous On call to O.R. 08/13/22 2219 08/14/22 1150     .  She was given sequential compression devices, early ambulation, and Lovenox TEDs for DVT prophylaxis.  She benefited maximally from the hospital stay and there were no complications.    Recent vital signs:  Vitals:   08/15/22 0442 08/15/22 0845  BP: 139/86 (!) 149/85  Pulse: 79 69  Resp: 20 17  Temp: 98 F (36.7 C) 98.2 F (36.8 C)  SpO2: 100% 100%    Recent laboratory studies:  Lab Results  Component Value Date   HGB 8.8 (L) 08/15/2022   HGB 10.3 (L) 07/29/2022   HGB 10.4 (L) 07/09/2022   Lab Results  Component Value Date   WBC 10.9 (H) 08/15/2022   PLT 334 08/15/2022   No results found for: "INR" Lab Results  Component Value Date   NA 136 08/15/2022   K 3.7 08/15/2022   CL 108 08/15/2022   CO2 22 08/15/2022   BUN <5 (L) 08/15/2022   CREATININE 0.61 08/15/2022   GLUCOSE 118 (H) 08/15/2022    Discharge Medications:   Allergies as of 08/15/2022       Reactions   Hydrocodone Itching   Pt can take if benadryl given   Oxycodone  Itching   Pt can take this if benadryl is given         Medication List     TAKE these medications    acetaminophen 500 MG tablet Commonly known as: TYLENOL Take 2 tablets (1,000 mg total) by mouth every 8 (eight) hours.   atenolol 50 MG tablet Commonly known as: TENORMIN TAKE 1 TABLET BY MOUTH EVERY DAY What changed: when to take this   celecoxib 200 MG capsule Commonly known as: CeleBREX Take 1 capsule (200 mg total) by mouth 2 (two) times daily for 14 days.   colchicine 0.6 MG tablet Take 0.6 mg by mouth as needed.   docusate sodium 100 MG capsule Commonly known as: COLACE Take 1 capsule (100 mg total) by mouth 2 (two) times daily.   enoxaparin 40 MG/0.4ML injection Commonly known as: LOVENOX Inject 0.4 mLs (40 mg total) into the skin daily for 14 days.   levonorgestrel 20 MCG/24HR IUD Commonly known as: MIRENA 1 Intra Uterine Device (1 each total) by Intrauterine route once for 1 dose.   ondansetron 4 MG tablet Commonly known as: ZOFRAN Take 1 tablet (4 mg total) by mouth every 6 (six) hours as needed for nausea.   PARoxetine 20 MG tablet Commonly known as: PAXIL Take 1 tablet (20 mg total) by mouth daily. What changed: when to take this   traMADol 50 MG tablet Commonly known as: ULTRAM Take 1 tablet (50 mg total) by mouth every 6 (six) hours as needed for moderate pain.        Diagnostic Studies: DG Knee 1-2 Views Right  Result Date: 08/14/2022 CLINICAL DATA:  Postop right total knee arthroplasty. EXAM: RIGHT KNEE - 1-2 VIEW COMPARISON:  Radiographs 08/11/2012. FINDINGS: Evidence of recent right total knee arthroplasty. The hardware is intact without evidence of loosening. No evidence of acute fracture or dislocation. A small amount of gas is present within the joint and soft tissues surrounding the knee. No unexpected foreign body. IMPRESSION: Status post right total knee arthroplasty without demonstrated complication. Electronically Signed   By:  Carey Bullocks M.D.   On: 08/14/2022 14:56    Disposition:      Follow-up Information     Evon Slack, PA-C Follow up in 2 week(s).   Specialties: Orthopedic Surgery, Emergency Medicine Contact information: [address] [place] [address] [phone]                  Signed: Patience Musca 08/15/2022, 1:42 PM

## 2022-08-15 NOTE — Progress Notes (Addendum)
   Subjective: 1 Day Post-Op Procedure(s) (LRB): TOTAL KNEE ARTHROPLASTY (Right) Patient reports pain as mild.   Patient is well, and has had no acute complaints or problems Denies any CP, SOB, ABD pain. We will continue therapy today.  Plan is to go Home after hospital stay.  Objective: Vital signs in last 24 hours: Temp:  [97.1 F (36.2 C)-98.3 F (36.8 C)] 98 F (36.7 C) (12/08 0442) Pulse Rate:  [61-91] 79 (12/08 0442) Resp:  [13-20] 20 (12/08 0442) BP: (139-176)/(86-101) 139/86 (12/08 0442) SpO2:  [99 %-100 %] 100 % (12/08 0442) Weight:  [97.2 kg] 97.2 kg (12/07 0935)  Intake/Output from previous day: 12/07 0701 - 12/08 0700 In: 1894.2 [I.V.:1501.6; IV Piggyback:392.6] Out: 200 [Urine:125; Blood:75] Intake/Output this shift: Total I/O In: 479.6 [I.V.:479.6] Out: -   Recent Labs    08/15/22 0432  HGB 8.8*   Recent Labs    08/15/22 0432  WBC 10.9*  RBC 3.23*  HCT 26.8*  PLT 334   Recent Labs    08/15/22 0432  NA 136  K 3.7  CL 108  CO2 22  BUN <5*  CREATININE 0.61  GLUCOSE 118*  CALCIUM 8.4*   No results for input(s): "LABPT", "INR" in the last 72 hours.  EXAM General - Patient is Alert, Appropriate, and Oriented Extremity - Neurovascular intact Sensation intact distally Intact pulses distally Dorsiflexion/Plantar flexion intact No cellulitis present Compartment soft Dressing - dressing C/D/I and no drainage Motor Function - intact, moving foot and toes well on exam.   Past Medical History:  Diagnosis Date   Abnormal LFTs    Alcohol abuse    Anemia    Arthritis    Gout    Hypertension    Hypokalemia    Hyponatremia    Marijuana abuse    Sickle cell trait (HCC)     Assessment/Plan:   1 Day Post-Op Procedure(s) (LRB): TOTAL KNEE ARTHROPLASTY (Right) Principal Problem:   S/P TKR (total knee replacement) using cement, right  Estimated body mass index is 39.19 kg/m as calculated from the following:   Height as of this encounter:  5\' 2"  (1.575 m).   Weight as of this encounter: 97.2 kg. Advance diet Up with therapy Pain well-controlled Labs and vital signs are stable Overall patient doing very well Care management to assist with discharge to home with home health PT today pending completion of PT goals  DVT Prophylaxis - Lovenox, TED hose, and SCDs Weight-Bearing as tolerated to right leg   T. , PA-C [facility] 08/15/2022, 8:22 AM  Patient seen and examined, agree with above plan.  The patient is doing well status post right total knee arthroplasty, no concerns at this time.  Pain is controlled.  Discussed DVT prophylaxis, pain medication use, and safe transition to home.  All questions answered the patient agrees with above plan will go  home after clears PT.   14/04/2022 MD

## 2022-08-15 NOTE — TOC Progression Note (Signed)
Transition of Care [facility]) - Progression Note    Patient Details  Name: Kara Murray MRN: [id number] Date of Birth: 09/15/1975  Transition of Care [facility]) CM/SW Contact  Marlowe Sax, RN Phone Number: 08/15/2022, 11:59 AM  Clinical Narrative:     The patient is set up with [facility] services prior to surgery by surgeons office       Expected Discharge Plan and Services                                                 Social Determinants of Health (SDOH) Interventions    Readmission Risk Interventions     No data to display

## 2022-08-15 NOTE — Plan of Care (Signed)
Problem: Education: Goal: Knowledge of the prescribed therapeutic regimen will improve 08/15/2022 1342 by Linna Darner, RN Outcome: Adequate for Discharge 08/15/2022 1251 by Linna Darner, RN Outcome: Progressing Goal: Individualized Educational Video(s) 08/15/2022 1342 by Linna Darner, RN Outcome: Adequate for Discharge 08/15/2022 1251 by Linna Darner, RN Outcome: Progressing   Problem: Activity: Goal: Ability to avoid complications of mobility impairment will improve 08/15/2022 1342 by Catlett, Harlen Labs, RN Outcome: Adequate for Discharge 08/15/2022 1251 by Linna Darner, RN Outcome: Progressing Goal: Range of joint motion will improve 08/15/2022 1342 by Catlett, Harlen Labs, RN Outcome: Adequate for Discharge 08/15/2022 1251 by Linna Darner, RN Outcome: Progressing   Problem: Clinical Measurements: Goal: Postoperative complications will be avoided or minimized 08/15/2022 1342 by Linna Darner, RN Outcome: Adequate for Discharge 08/15/2022 1251 by Linna Darner, RN Outcome: Progressing   Problem: Pain Management: Goal: Pain level will decrease with appropriate interventions 08/15/2022 1342 by Linna Darner, RN Outcome: Adequate for Discharge 08/15/2022 1251 by Linna Darner, RN Outcome: Progressing   Problem: Skin Integrity: Goal: Will show signs of wound healing 08/15/2022 1342 by Linna Darner, RN Outcome: Adequate for Discharge 08/15/2022 1251 by Catlett, Harlen Labs, RN Outcome: Progressing   Problem: Education: Goal: Knowledge of General Education information will improve Description: Including pain rating scale, medication(s)/side effects and non-pharmacologic comfort measures 08/15/2022 1342 by Linna Darner, RN Outcome: Adequate for Discharge 08/15/2022 1251 by Linna Darner, RN Outcome: Progressing   Problem: Health Behavior/Discharge Planning: Goal: Ability to manage health-related needs will improve 08/15/2022 1342  by Catlett, Harlen Labs, RN Outcome: Adequate for Discharge 08/15/2022 1251 by Linna Darner, RN Outcome: Progressing   Problem: Clinical Measurements: Goal: Ability to maintain clinical measurements within normal limits will improve 08/15/2022 1342 by Catlett, Harlen Labs, RN Outcome: Adequate for Discharge 08/15/2022 1251 by Linna Darner, RN Outcome: Progressing Goal: Will remain free from infection 08/15/2022 1342 by Linna Darner, RN Outcome: Adequate for Discharge 08/15/2022 1251 by Linna Darner, RN Outcome: Progressing Goal: Diagnostic test results will improve 08/15/2022 1342 by Linna Darner, RN Outcome: Adequate for Discharge 08/15/2022 1251 by Linna Darner, RN Outcome: Progressing Goal: Respiratory complications will improve 08/15/2022 1342 by Linna Darner, RN Outcome: Adequate for Discharge 08/15/2022 1251 by Linna Darner, RN Outcome: Progressing Goal: Cardiovascular complication will be avoided 08/15/2022 1342 by Linna Darner, RN Outcome: Adequate for Discharge 08/15/2022 1251 by Linna Darner, RN Outcome: Progressing   Problem: Activity: Goal: Risk for activity intolerance will decrease 08/15/2022 1342 by Catlett, Harlen Labs, RN Outcome: Adequate for Discharge 08/15/2022 1251 by Linna Darner, RN Outcome: Progressing   Problem: Nutrition: Goal: Adequate nutrition will be maintained 08/15/2022 1342 by Linna Darner, RN Outcome: Adequate for Discharge 08/15/2022 1251 by Linna Darner, RN Outcome: Progressing   Problem: Coping: Goal: Level of anxiety will decrease 08/15/2022 1342 by Linna Darner, RN Outcome: Adequate for Discharge 08/15/2022 1251 by Linna Darner, RN Outcome: Progressing   Problem: Elimination: Goal: Will not experience complications related to bowel motility 08/15/2022 1342 by Linna Darner, RN Outcome: Adequate for Discharge 08/15/2022 1251 by Linna Darner, RN Outcome:  Progressing Goal: Will not experience complications related to urinary retention 08/15/2022 1342 by Catlett, Harlen Labs, RN Outcome: Adequate for Discharge 08/15/2022 1251 by Linna Darner, RN Outcome: Progressing   Problem: Pain Managment: Goal: General experience of comfort will improve 08/15/2022 1342 by  Catlett, Harlen Labs, RN Outcome: Adequate for Discharge 08/15/2022 1251 by Linna Darner, RN Outcome: Progressing   Problem: Safety: Goal: Ability to remain free from injury will improve 08/15/2022 1342 by Catlett, Harlen Labs, RN Outcome: Adequate for Discharge 08/15/2022 1251 by Linna Darner, RN Outcome: Progressing   Problem: Skin Integrity: Goal: Risk for impaired skin integrity will decrease 08/15/2022 1342 by Linna Darner, RN Outcome: Adequate for Discharge 08/15/2022 1251 by Catlett, Harlen Labs, RN Outcome: Progressing

## 2022-08-15 NOTE — Progress Notes (Signed)
Physical Therapy Treatment Patient Details Name: Kara Murray MRN: [id number] DOB: [birth year] Today's Date: 08/15/2022   History of Present Illness 46 y/o female s/p R TKA on 08/14/22. PMH: hx of gastric bypass    PT Comments    Patient making good progress towards physical therapy goals. Ambulated 220' with RW and supervision. Continues to require cues for heel strike and knee flexion as patient tends to circumduct R LE to bring forward. Discussed frequency and repetitions of exercises on HEP handout. Family present and supportive. D/c plan remains appropriate.     Recommendations for follow up therapy are one component of Murray multi-disciplinary discharge planning process, led by the attending physician.  Recommendations may be updated based on patient status, additional functional criteria and insurance authorization.  Follow Up Recommendations  Follow physician's recommendations for discharge plan and follow up therapies     Assistance Recommended at Discharge PRN  Patient can return home with the following Murray little help with bathing/dressing/bathroom;Assistance with cooking/housework;Help with stairs or ramp for entrance;Assist for transportation   Equipment Recommendations  Rolling Kara Murray (2 wheels);BSC/3in1    Recommendations for Other Services       Precautions / Restrictions Precautions Precautions: Knee Precaution Booklet Issued: Yes (comment) Restrictions Weight Bearing Restrictions: Yes RLE Weight Bearing: Weight bearing as tolerated     Mobility  Bed Mobility Overal bed mobility: Modified Independent                  Transfers Overall transfer level: Modified independent Equipment used: Rolling Kara Murray (2 wheels) Transfers: Sit to/from Stand Sit to Stand: Supervision                Ambulation/Gait Ambulation/Gait assistance: Supervision Gait Distance (Feet): 220 Feet Assistive device: Rolling Kara Murray (2 wheels) Gait  Pattern/deviations: Step-through pattern, Decreased stride length Gait velocity: decreased     General Gait Details: cues for heel strike and knee flexion as patient tends to circumduct to bring R leg forward   Stairs Stairs: Yes Stairs assistance: Supervision Stair Management: One rail Right, Step to pattern, Forwards Number of Stairs: 2     Wheelchair Mobility    Modified Rankin (Stroke Patients Only)       Balance Overall balance assessment: Needs assistance Sitting-balance support: No upper extremity supported, Feet supported Sitting balance-Leahy Scale: Good     Standing balance support: Bilateral upper extremity supported, Reliant on assistive device for balance Standing balance-Leahy Scale: Poor Standing balance comment: reliant on RW for support to offweight R LE                            Cognition Arousal/Alertness: Awake/alert Behavior During Therapy: WFL for tasks assessed/performed Overall Cognitive Status: Within Functional Limits for tasks assessed                                          Exercises Total Joint Exercises Ankle Circles/Pumps: AROM, Both, 10 reps Quad Sets: AROM, Right, 5 reps (long sitting) Heel Slides: AROM, Right, 5 reps (long sitting) Hip ABduction/ADduction: AROM, Right, 5 reps (long sitting) Straight Leg Raises: AROM, Right, 5 reps (long sitting) Long Arc Quad: AROM, Right, 5 reps (long sitting)    General Comments        Pertinent Vitals/Pain Pain Assessment Pain Assessment: Faces Faces Pain Scale: Hurts Murray little bit Pain Location: R knee  Pain Descriptors / Indicators: Sore Pain Intervention(s): Monitored during session    Home Living Family/patient expects to be discharged to:: Private residence Living Arrangements: Children;Parent Available Help at Discharge: Family Type of Home: House Home Access: Stairs to enter Entrance Stairs-Rails: Right Entrance Stairs-Number of Steps: 2    Home Layout: One level Home Equipment: None      Prior Function            PT Goals (current goals can now be found in the care plan section) Acute Rehab PT Goals Patient Stated Goal: to go home PT Goal Formulation: With patient Time For Goal Achievement: 08/29/22 Potential to Achieve Goals: Good Progress towards PT goals: Progressing toward goals    Frequency    BID      PT Plan Current plan remains appropriate    Co-evaluation              AM-PAC PT "6 Clicks" Mobility   Outcome Measure  Help needed turning from your back to your side while in Murray flat bed without using bedrails?: None Help needed moving from lying on your back to sitting on the side of Murray flat bed without using bedrails?: None Help needed moving to and from Murray bed to Murray chair (including Murray wheelchair)?: Murray Little Help needed standing up from Murray chair using your arms (e.g., wheelchair or bedside chair)?: Murray Little Help needed to walk in hospital room?: Murray Little Help needed climbing 3-5 steps with Murray railing? : Murray Little 6 Click Score: 20    End of Session Equipment Utilized During Treatment: Gait belt Activity Tolerance: Patient tolerated treatment well Patient left: in bed;with call bell/phone within reach;with family/visitor present (sitting EOB) Nurse Communication: Mobility status PT Visit Diagnosis: Unsteadiness on feet (R26.81);Muscle weakness (generalized) (M62.81);Difficulty in walking, not elsewhere classified (R26.2)     Time: 3785-8850 PT Time Calculation (min) (ACUTE ONLY): 24 min  Charges:  $Gait Training: 23-37 mins $Therapeutic Exercise: 8-22 mins $Therapeutic Activity: 8-22 mins                     Kara Murray Kara Murray PT, DPT [facility] - Acute Rehabilitation Services    Kara Murray Kara Murray 08/15/2022, 2:09 PM

## 2022-08-15 NOTE — Discharge Instructions (Signed)
 Instructions after Total Knee Replacement   Kara Murray M.D.     Dept. of Orthopaedics & Sports Medicine  [facility]  [address]  [place], [place]  [address]  Phone: [phone]   Fax: [phone]    DIET: Drink plenty of non-alcoholic fluids. Resume your normal diet. Include foods high in fiber.  ACTIVITY:  You may use crutches or a walker with weight-bearing as tolerated, unless instructed otherwise. You may be weaned off of the walker or crutches by your Physical Therapist.  Do NOT place pillows under the knee. Anything placed under the knee could limit your ability to straighten the knee.   Continue doing gentle exercises. Exercising will reduce the pain and swelling, increase motion, and prevent muscle weakness.   Please continue to use the TED compression stockings for 2 weeks. You may remove the stockings at night, but should reapply them in the morning. Do not drive or operate any equipment until instructed.  WOUND CARE:  Continue to use the PolarCare or ice packs periodically to reduce pain and swelling. You may begin showering 3 days after surgery with honeycomb dressing. Remove honeycomb dressing 7 days after surgery and continue showering. Allow dermabond to fall off on its own.  MEDICATIONS: You may resume your regular medications. Please take the pain medication as prescribed on the medication. Do not take pain medication on an empty stomach. You have been given a prescription for a blood thinner (Lovenox or Coumadin). Please take the medication as instructed. (NOTE: After completing a 2 week course of Lovenox, take one 81 mg Enteric-coated aspirin twice a day. This along with elevation will help reduce the possibility of phlebitis in your operated leg.) Do not drive or drink alcoholic beverages when taking pain medications.  POSTOPERATIVE CONSTIPATION PROTOCOL Constipation - defined medically as fewer than three stools per week and severe  constipation as less than one stool per week.  One of the most common issues patients have following surgery is constipation.  Even if you have a regular bowel pattern at home, your normal regimen is likely to be disrupted due to multiple reasons following surgery.  Combination of anesthesia, postoperative narcotics, change in appetite and fluid intake all can affect your bowels.  In order to avoid complications following surgery, here are some recommendations in order to help you during your recovery period.  Colace (docusate) - Pick up an over-the-counter form of Colace or another stool softener and take twice a day as long as you are requiring postoperative pain medications.  Take with a full glass of water daily.  If you experience loose stools or diarrhea, hold the colace until you stool forms back up.  If your symptoms do not get better within 1 week or if they get worse, check with your doctor.  Dulcolax (bisacodyl) - Pick up over-the-counter and take as directed by the product packaging as needed to assist with the movement of your bowels.  Take with a full glass of water.  Use this product as needed if not relieved by Colace only.   MiraLax (polyethylene glycol) - Pick up over-the-counter to have on hand.  MiraLax is a solution that will increase the amount of water in your bowels to assist with bowel movements.  Take as directed and can mix with a glass of water, juice, soda, coffee, or tea.  Take if you go more than two days without a movement. Do not use MiraLax more than once per day. Call your doctor if   you are still constipated or irregular after using this medication for 7 days in a row.  If you continue to have problems with postoperative constipation, please contact the office for further assistance and recommendations.  If you experience "the worst abdominal pain ever" or develop nausea or vomiting, please contact the office immediatly for further recommendations for  treatment.   CALL THE OFFICE FOR: Temperature above 101 degrees Excessive bleeding or drainage on the dressing. Excessive swelling, coldness, or paleness of the toes. Persistent nausea and vomiting.  FOLLOW-UP:  You should have an appointment to return to the office in 14 days after surgery. Arrangements have been made for continuation of Physical Therapy (either home therapy or outpatient therapy).  

## 2022-09-15 NOTE — Progress Notes (Deleted)
PCP:  [name], MD   No chief complaint on file.    HPI:      [name] is a 47 y.o. [id number] who LMP was No LMP recorded. (Menstrual status: IUD)., presents today for her annual examination.  Her menses are infrequent with IUD, lasting 1 days, light flow with wiping only.  Dysmenorrhea none. She does have vasomotor sx.  Sex activity: single partner, contraception - IUD. Mirena REplaced 01/06/18.  Last Pap: 11/30/17  Results were: no abnormalities /neg HPV DNA  Hx of STDs: chlamydia in college  Last mammogram: December 23, 2012  Results were: normal--routine follow-up in 12 months.  There is a FH of breast cancer in her mat aunt and cousin, genetic testing not indicated. There is no FH of ovarian cancer. The patient does do self-breast exams.  Tobacco use: The patient denies current or previous tobacco use. Alcohol use: social drinker No drug use.  Exercise: min active  Colooscopy: never  She does get adequate calcium but not Vitamin D in her diet.  Labs with PCP.   Past Medical History:  Diagnosis Date   Abnormal LFTs    Alcohol abuse    Anemia    Arthritis    Gout    Hypertension    Hypokalemia    Hyponatremia    Marijuana abuse    Sickle cell trait [facility])     Past Surgical History:  Procedure Laterality Date   biariactric surgery  09/08/2008   KNEE SURGERY Right 1994, 2001   THUMB FUSION Left 04/11/2014   TOE SURGERY  09/08/2006   TOTAL KNEE ARTHROPLASTY Right 08/14/2022   Procedure: TOTAL KNEE ARTHROPLASTY;  Surgeon: Reinaldo Berber, MD;  Location: [facility] ORS;  Service: Orthopedics;  Laterality: Right;    Family History  Problem Relation Age of Onset   Hypertension Mother    Diabetes Mother    Heart attack Father    Hypertension Brother    Breast cancer Maternal Aunt 37   Breast cancer Cousin     Social History   Socioeconomic History   Marital status: Married    Spouse name: Not on file   Number of children: Not on file    Years of education: Not on file   Highest education level: Not on file  Occupational History   Not on file  Tobacco Use   Smoking status: Former    Types: Cigarettes    Quit date: 2019    Years since quitting: 5.0   Smokeless tobacco: Never   Tobacco comments:    socially  Advertising account planner   Vaping Use: Never used  Substance and Sexual Activity   Alcohol use: Yes    Alcohol/week: 2.0 standard drinks of alcohol    Types: 2 Cans of beer per week    Comment: socially    Drug use: Yes    Types: Marijuana   Sexual activity: Not Currently    Birth control/protection: I.U.D.    Comment: Mirena   Other Topics Concern   Not on file  Social History Narrative   Not on file   Social Determinants of Health   Financial Resource Strain: Not on file  Food Insecurity: No Food Insecurity (08/14/2022)   Hunger Vital Sign    Worried About Running Out of Food in the Last Year: Never true    Ran Out of Food in the Last Year: Never true  Transportation Needs: No Transportation Needs (08/14/2022)   PRAPARE - Transportation  Lack of Transportation (Medical): No    Lack of Transportation (Non-Medical): No  Physical Activity: Not on file  Stress: Not on file  Social Connections: Not on file  Intimate Partner Violence: Not At Risk (08/14/2022)   Humiliation, Afraid, Rape, and Kick questionnaire    Fear of Current or Ex-Partner: No    Emotionally Abused: No    Physically Abused: No    Sexually Abused: No    Outpatient Medications Prior to Visit  Medication Sig Dispense Refill   acetaminophen (TYLENOL) 500 MG tablet Take 2 tablets (1,000 mg total) by mouth every 8 (eight) hours. 30 tablet 0   atenolol (TENORMIN) 50 MG tablet TAKE 1 TABLET BY MOUTH EVERY DAY (Patient taking differently: Take 50 mg by mouth every morning.) 30 tablet 3   colchicine 0.6 MG tablet Take 0.6 mg by mouth as needed.     docusate sodium (COLACE) 100 MG capsule Take 1 capsule (100 mg total) by mouth 2 (two) times daily. 10  capsule 0   enoxaparin (LOVENOX) 40 MG/0.4ML injection Inject 0.4 mLs (40 mg total) into the skin daily for 14 days. 5.6 mL 0   levonorgestrel (MIRENA) 20 MCG/24HR IUD 1 Intra Uterine Device (1 each total) by Intrauterine route once for 1 dose. 1 each 0   ondansetron (ZOFRAN) 4 MG tablet Take 1 tablet (4 mg total) by mouth every 6 (six) hours as needed for nausea. 20 tablet 0   PARoxetine (PAXIL) 20 MG tablet Take 1 tablet (20 mg total) by mouth daily. (Patient taking differently: Take 20 mg by mouth every morning.) 90 tablet 1   traMADol (ULTRAM) 50 MG tablet Take 1 tablet (50 mg total) by mouth every 6 (six) hours as needed for moderate pain. 30 tablet 0   No facility-administered medications prior to visit.      ROS:  Review of Systems  Constitutional:  Negative for fatigue, fever and unexpected weight change.  Respiratory:  Negative for cough, shortness of breath and wheezing.   Cardiovascular:  Negative for chest pain, palpitations and leg swelling.  Gastrointestinal:  Negative for blood in stool, constipation, diarrhea, nausea and vomiting.  Endocrine: Negative for cold intolerance, heat intolerance and polyuria.  Genitourinary:  Negative for dyspareunia, dysuria, flank pain, frequency, genital sores, hematuria, menstrual problem, pelvic pain, urgency, vaginal bleeding, vaginal discharge and vaginal pain.  Musculoskeletal:  Negative for back pain, joint swelling and myalgias.  Skin:  Negative for rash.  Neurological:  Negative for dizziness, syncope, light-headedness, numbness and headaches.  Hematological:  Negative for adenopathy.  Psychiatric/Behavioral:  Negative for agitation, confusion, sleep disturbance and suicidal ideas. The patient is not nervous/anxious.    BREAST: No symptoms   Objective: There were no vitals taken for this visit.   Physical Exam Constitutional:      Appearance: She is well-developed.  Genitourinary:     Vulva normal.     No vaginal discharge,  erythema or tenderness.      Right Adnexa: not tender and no mass present.    Left Adnexa: not tender and no mass present.    No cervical motion tenderness or polyp.     IUD strings visualized.     Uterus is not enlarged or tender.  Breasts:    Right: No mass, nipple discharge, skin change or tenderness.     Left: No mass, nipple discharge, skin change or tenderness.  Neck:     Thyroid: No thyromegaly.  Cardiovascular:     Rate and Rhythm: Normal  rate and regular rhythm.     Heart sounds: Normal heart sounds. No murmur heard. Pulmonary:     Effort: Pulmonary effort is normal.     Breath sounds: Normal breath sounds.  Abdominal:     Palpations: Abdomen is soft.     Tenderness: There is no abdominal tenderness. There is no guarding.  Musculoskeletal:        General: Normal range of motion.     Cervical back: Normal range of motion.  Neurological:     General: No focal deficit present.     Mental Status: She is alert and oriented to person, place, and time.     Cranial Nerves: No cranial nerve deficit.  Skin:    General: Skin is warm and dry.  Psychiatric:        Mood and Affect: Mood normal.        Behavior: Behavior normal.        Thought Content: Thought content normal.        Judgment: Judgment normal.  Vitals reviewed.     Assessment/Plan: Encounter for annual routine gynecological examination  Encounter for routine checking of intrauterine contraceptive device (IUD)  Encounter for screening mammogram for malignant neoplasm of breast - Plan: 3D MAMMOGRAM SCREENING BILATERAL; pt to sched mammo  GYN counsel breast self exam, mammography screening, family planning choices, adequate intake of calcium and vitamin D, diet and exercise     F/U   No follow-ups on file.  Liberta Gimpel B. Jordell Outten, PA-C 09/15/2022 7:39 PM

## 2022-09-16 ENCOUNTER — Ambulatory Visit: Payer: BC Managed Care – PPO | Admitting: Obstetrics and Gynecology

## 2022-09-16 DIAGNOSIS — Z1151 Encounter for screening for human papillomavirus (HPV): Secondary | ICD-10-CM

## 2022-09-16 DIAGNOSIS — Z01419 Encounter for gynecological examination (general) (routine) without abnormal findings: Secondary | ICD-10-CM

## 2022-09-16 DIAGNOSIS — Z1231 Encounter for screening mammogram for malignant neoplasm of breast: Secondary | ICD-10-CM

## 2022-09-16 DIAGNOSIS — Z124 Encounter for screening for malignant neoplasm of cervix: Secondary | ICD-10-CM

## 2022-09-16 DIAGNOSIS — Z1211 Encounter for screening for malignant neoplasm of colon: Secondary | ICD-10-CM

## 2022-10-20 NOTE — Progress Notes (Deleted)
PCP:  Birdie Sons, MD   No chief complaint on file.    HPI:      [name] is a 47 y.o. [id number] who LMP was No LMP recorded. (Menstrual status: IUD)., presents today for her NP> 80yr annual examination.  Her menses are infrequent with IUD, lasting 1 days, light flow with wiping only.  Dysmenorrhea none. She does have vasomotor sx.  Sex activity: single partner, contraception - IUD. Mirena REplaced 01/06/18.  Last Pap: 11/30/17  Results were: no abnormalities /neg HPV DNA  Hx of STDs: chlamydia in college  Last mammogram: December 23, 2012  Results were: normal--routine follow-up in 12 months.  There is a FH of breast cancer in her mat aunt and cousin, genetic testing not indicated. There is no FH of ovarian cancer. The patient does do self-breast exams.  Tobacco use: The patient denies current or previous tobacco use. Alcohol use: social drinker No drug use.  Exercise: min active  Colonoscopy:   She does get adequate calcium but not Vitamin D in her diet.  Labs with PCP.   Past Medical History:  Diagnosis Date   Abnormal LFTs    Alcohol abuse    Anemia    Arthritis    Gout    Hypertension    Hypokalemia    Hyponatremia    Marijuana abuse    Sickle cell trait ([facility]     Past Surgical History:  Procedure Laterality Date   biariactric surgery  09/08/2008   KNEE SURGERY Right 1994, 2001   THUMB FUSION Left 04/11/2014   TOE SURGERY  09/08/2006   TOTAL KNEE ARTHROPLASTY Right 08/14/2022   Procedure: TOTAL KNEE ARTHROPLASTY;  Surgeon: ASteffanie Rainwater MD;  Location: [facility] ORS;  Service: Orthopedics;  Laterality: Right;    Family History  Problem Relation Age of Onset   Hypertension Mother    Diabetes Mother    Heart attack Father    Hypertension Brother    Breast cancer Maternal Aunt 511  Breast cancer Cousin     Social History   Socioeconomic History   Marital status: Married    Spouse name: Not on file   Number of children: Not on file    Years of education: Not on file   Highest education level: Not on file  Occupational History   Not on file  Tobacco Use   Smoking status: Former    Types: Cigarettes    Quit date: 2019    Years since quitting: 5.1   Smokeless tobacco: Never   Tobacco comments:    socially  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    Comment: Mirena   Other Topics Concern   Not on file  Social History Narrative   Not on file   Social Determinants of Health   Financial Resource Strain: Not on file  Food Insecurity: No Food Insecurity (08/14/2022)   Hunger Vital Sign    Worried About Running Out of Food in the Last Year: Never true    Ran Out of Food in the Last Year: Never true  Transportation Needs: No Transportation Needs (08/14/2022)   PRAPARE -  Hydrologist (Medical): No    Lack of Transportation (Non-Medical): No  Physical Activity: Not on file  Stress: Not on file  Social Connections: Not on file  Intimate Partner Violence: Not At Risk (08/14/2022)   Humiliation, Afraid, Rape, and Kick questionnaire    Fear of Current or Ex-Partner: No    Emotionally Abused: No    Physically Abused: No    Sexually Abused: No    Outpatient Medications Prior to Visit  Medication Sig Dispense Refill   acetaminophen (TYLENOL) 500 MG tablet Take 2 tablets (1,000 mg total) by mouth every 8 (eight) hours. 30 tablet 0   atenolol (TENORMIN) 50 MG tablet TAKE 1 TABLET BY MOUTH EVERY DAY (Patient taking differently: Take 50 mg by mouth every morning.) 30 tablet 3   colchicine 0.6 MG tablet Take 0.6 mg by mouth as needed.     docusate sodium (COLACE) 100 MG capsule Take 1 capsule (100 mg total) by mouth 2 (two) times daily.  10 capsule 0   enoxaparin (LOVENOX) 40 MG/0.4ML injection Inject 0.4 mLs (40 mg total) into the skin daily for 14 days. 5.6 mL 0   levonorgestrel (MIRENA) 20 MCG/24HR IUD 1 Intra Uterine Device (1 each total) by Intrauterine route once for 1 dose. 1 each 0   ondansetron (ZOFRAN) 4 MG tablet Take 1 tablet (4 mg total) by mouth every 6 (six) hours as needed for nausea. 20 tablet 0   PARoxetine (PAXIL) 20 MG tablet Take 1 tablet (20 mg total) by mouth daily. (Patient taking differently: Take 20 mg by mouth every morning.) 90 tablet 1   traMADol (ULTRAM) 50 MG tablet Take 1 tablet (50 mg total) by mouth every 6 (six) hours as needed for moderate pain. 30 tablet 0   No facility-administered medications prior to visit.      ROS:  Review of Systems  Constitutional:  Negative for fatigue, fever and unexpected weight change.  Respiratory:  Negative for cough, shortness of breath and wheezing.   Cardiovascular:  Negative for chest pain, palpitations and leg swelling.  Gastrointestinal:  Negative for blood in stool, constipation, diarrhea, nausea and vomiting.  Endocrine: Negative for cold intolerance, heat intolerance and polyuria.  Genitourinary:  Negative for dyspareunia, dysuria, flank pain, frequency, genital sores, hematuria, menstrual problem, pelvic pain, urgency, vaginal bleeding, vaginal discharge and vaginal pain.  Musculoskeletal:  Negative for back pain, joint swelling and myalgias.  Skin:  Negative for rash.  Neurological:  Negative for dizziness, syncope, light-headedness, numbness and headaches.  Hematological:  Negative for adenopathy.  Psychiatric/Behavioral:  Negative for agitation, confusion, sleep disturbance and suicidal ideas. The patient is not nervous/anxious.    BREAST: No symptoms   Objective: There were no vitals taken for this visit.   Physical Exam Constitutional:      Appearance: She is well-developed.  Genitourinary:     Vulva normal.     No vaginal  discharge, erythema or tenderness.      Right Adnexa: not tender and no mass present.    Left Adnexa: not tender and no mass present.    No cervical motion tenderness or polyp.     IUD strings visualized.     Uterus is not enlarged or tender.  Breasts:    Right: No mass, nipple discharge, skin change or tenderness.     Left: No mass, nipple discharge, skin change or tenderness.  Neck:     Thyroid: No thyromegaly.  Cardiovascular:  Rate and Rhythm: Normal rate and regular rhythm.     Heart sounds: Normal heart sounds. No murmur heard. Pulmonary:     Effort: Pulmonary effort is normal.     Breath sounds: Normal breath sounds.  Abdominal:     Palpations: Abdomen is soft.     Tenderness: There is no abdominal tenderness. There is no guarding.  Musculoskeletal:        General: Normal range of motion.     Cervical back: Normal range of motion.  Neurological:     General: No focal deficit present.     Mental Status: She is alert and oriented to person, place, and time.     Cranial Nerves: No cranial nerve deficit.  Skin:    General: Skin is warm and dry.  Psychiatric:        Mood and Affect: Mood normal.        Behavior: Behavior normal.        Thought Content: Thought content normal.        Judgment: Judgment normal.  Vitals reviewed.     Assessment/Plan: Encounter for annual routine gynecological examination  Encounter for routine checking of intrauterine contraceptive device (IUD)  Encounter for screening mammogram for malignant neoplasm of breast - Plan: 3D MAMMOGRAM SCREENING BILATERAL; pt to sched mammo  GYN counsel breast self exam, mammography screening, family planning choices, adequate intake of calcium and vitamin D, diet and exercise     F/U   No follow-ups on file.  Danel Requena B. Kassi Esteve, PA-C 10/20/2022 7:47 PM

## 2022-10-21 ENCOUNTER — Ambulatory Visit: Payer: BC Managed Care – PPO | Admitting: Obstetrics and Gynecology

## 2022-10-21 DIAGNOSIS — Z1211 Encounter for screening for malignant neoplasm of colon: Secondary | ICD-10-CM

## 2022-10-21 DIAGNOSIS — Z124 Encounter for screening for malignant neoplasm of cervix: Secondary | ICD-10-CM

## 2022-10-21 DIAGNOSIS — Z1151 Encounter for screening for human papillomavirus (HPV): Secondary | ICD-10-CM

## 2022-10-21 DIAGNOSIS — Z01419 Encounter for gynecological examination (general) (routine) without abnormal findings: Secondary | ICD-10-CM

## 2022-10-21 DIAGNOSIS — Z30431 Encounter for routine checking of intrauterine contraceptive device: Secondary | ICD-10-CM

## 2022-10-21 DIAGNOSIS — Z1231 Encounter for screening mammogram for malignant neoplasm of breast: Secondary | ICD-10-CM

## 2022-10-27 ENCOUNTER — Ambulatory Visit
Admission: RE | Admit: 2022-10-27 | Discharge: 2022-10-27 | Disposition: A | Discharge status: Home / Self Care | Payer: BC Managed Care – PPO | Source: Ambulatory Visit

## 2022-10-27 VITALS — BP 170/102 | HR 105 | Temp 98.5°F | Resp 18

## 2022-10-27 DIAGNOSIS — I10 Essential (primary) hypertension: Secondary | ICD-10-CM | POA: Diagnosis not present

## 2022-10-27 DIAGNOSIS — J0100 Acute maxillary sinusitis, unspecified: Secondary | ICD-10-CM

## 2022-10-27 MED ORDER — AMOXICILLIN 875 MG PO TABS: 875.0000 mg | ORAL_TABLET | Freq: Two times a day (BID) | ORAL | 0 refills | Status: AC

## 2022-10-27 NOTE — ED Triage Notes (Signed)
Patient to Urgent Care with complaints of productive cough and sinus congestion. Reports constant nasal drainage. Chest congestion. Denies any known fevers. States she is prone to sinus infections.  Reports symptoms started Friday. Has been taking mucinex DM/ nyquil.

## 2022-10-27 NOTE — ED Provider Notes (Signed)
Roderic Palau    CSN: [id number] Arrival date & time: 10/27/22  0947      History   Chief Complaint Chief Complaint  Patient presents with   Cough    Sinus congestion - Entered by patient    HPI Kara Murray is a 47 y.o. female.  Patient presents with 2-week history of sinus pressure, congestion, postnasal drip, sneezing.  She reports cough and chest congestion x 3 days.  No fever, chills, shortness of breath, or other symptoms.  Treating symptoms with Mucinex and NyQuil.  Her medical history includes hypertension.   The history is provided by the patient and medical records.    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   Electrolyte disturbance 07/09/2022   Insomnia 07/09/2022   Alcohol abuse 07/09/2022   Hyponatremia 07/09/2022   Hypokalemia 07/09/2022   Chronic diarrhea 07/09/2022   Abnormal LFTs 07/09/2022   Sickle cell trait ([place]) 01/20/2022   Elevated ferritin 01/20/2022   Hypertriglyceridemia 01/08/2022   History of anemia 01/08/2022   Family history of diabetes mellitus (DM) 01/08/2022   History of gout 01/08/2022   Osteoarthritis of knee 11/13/2017   Localized, primary osteoarthritis 11/13/2017   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   Hypertension 05/29/2015    Past Surgical History:  Procedure Laterality Date   biariactric surgery  09/08/2008   KNEE SURGERY Right 1994, 2001   THUMB FUSION Left 04/11/2014   TOE SURGERY  09/08/2006   TOTAL KNEE ARTHROPLASTY Right 08/14/2022   Procedure: TOTAL KNEE ARTHROPLASTY;  Surgeon: Steffanie Rainwater, MD;  Location: [facility] ORS;  Service: Orthopedics;  Laterality: Right;    OB History     Gravida  4   Para  2   Term   2   Preterm  0   AB  2   Living  2      SAB  0   IAB  2   Ectopic  0   Multiple  0   Live Births  2            Home Medications    Prior to Admission medications   Medication Sig Start Date End Date Taking? Authorizing Provider  acetaminophen (TYLENOL) 500 MG tablet Take 2 tablets (1,000 mg total) by mouth every 8 (eight) hours. 08/15/22   Duanne Guess, PA-C  amLODipine (NORVASC) 5 MG tablet Take 5 mg by mouth daily.    [provider]  amoxicillin (AMOXIL) 875 MG tablet Take 1 tablet (875 mg total) by mouth 2 (two) times daily for 7 days. 10/27/22 11/03/22 Yes Sharion Balloon, NP  atenolol (TENORMIN) 50 MG tablet TAKE 1 TABLET BY MOUTH EVERY DAY Patient taking differently: Take 50 mg by mouth every morning. 08/04/22   Birdie Sons, MD  colchicine 0.6 MG tablet Take 0.6 mg by mouth as needed. Patient not taking: Reported on 10/27/2022 09/13/21   [provider]  docusate sodium (COLACE) 100 MG capsule Take 1 capsule (100 mg total) by mouth 2 (two) times daily. Patient not taking: Reported on 10/27/2022 08/15/22   Duanne Guess, PA-C  enoxaparin (LOVENOX) 40 MG/0.4ML injection Inject 0.4 mLs (40 mg total) into the skin daily for 14 days. Patient not taking: Reported on 10/27/2022 08/15/22 08/29/22  Duanne Guess, PA-C  levonorgestrel [facility]) 20 MCG/24HR IUD 1 Intra Uterine Device (1 each total) by Intrauterine route once for 1 dose. 01/06/18 [id number]  Copland, Deirdre Evener, PA-C  ondansetron (ZOFRAN) 4 MG tablet Take 1 tablet (4 mg total) by mouth every 6 (six) hours as needed for nausea. Patient not taking: Reported on 10/27/2022 08/15/22   Duanne Guess, PA-C  PARoxetine (PAXIL) 20 MG tablet Take 1 tablet (20 mg total) by mouth daily. Patient taking differently: Take 20 mg by mouth every morning. 08/04/22   Birdie Sons, MD  traMADol (ULTRAM) 50 MG tablet Take 1 tablet (50 mg total) by mouth every 6 (six) hours as needed for moderate pain. Patient  not taking: Reported on 10/27/2022 08/15/22   Duanne Guess, PA-C    Family History Family History  Problem Relation Age of Onset   Hypertension Mother    Diabetes Mother    Heart attack Father    Hypertension Brother    Breast cancer Maternal Aunt 50   Breast cancer Cousin     Social History Social History   Tobacco Use   Smoking status: Former    Types: Cigarettes    Quit date: 2019    Years since quitting: 5.1   Smokeless tobacco: Never   Tobacco comments:    socially  Media planner   Vaping Use: Never used  Substance Use Topics   Alcohol use: Yes    Alcohol/week: 2.0 standard drinks of alcohol    Types: 2 Cans of beer per week    Comment: socially    Drug use: Yes    Types: Marijuana     Allergies   Hydrocodone and Oxycodone   Review of Systems Review of Systems  Constitutional:  Negative for chills and fever.  HENT:  Positive for congestion, postnasal drip, rhinorrhea, sinus pressure and sneezing. Negative for ear pain and sore throat.   Respiratory:  Positive for cough. Negative for shortness of breath.   Cardiovascular:  Negative for chest pain and palpitations.  Gastrointestinal:  Negative for diarrhea and vomiting.  Skin:  Negative for color change and rash.  All other systems reviewed and are negative.    Physical Exam Triage Vital Signs ED Triage Vitals  Enc Vitals Group     BP 10/27/22 1008 (!) 164/100     Pulse Rate 10/27/22 0955 (!) 105     Resp 10/27/22 0955 18     Temp 10/27/22 0955 98.5 F (36.9 C)     Temp src --      SpO2 10/27/22 0955 97 %     Weight --      Height --      Head Circumference --      Peak Flow --      Pain Score 10/27/22 0958 5     Pain Loc --      Pain Edu? --      Excl. in [place]? --    No data found.  Updated Vital Signs BP (!) 170/102   Pulse (!) 105   Temp 98.5 F (36.9 C)   Resp 18   SpO2 97%   Visual Acuity Right Eye Distance:   Left Eye Distance:   Bilateral Distance:    Right Eye Near:    Left Eye Near:    Bilateral  Near:     Physical Exam Vitals and nursing note reviewed.  Constitutional:      General: She is not in acute distress.    Appearance: She is well-developed. She is obese. She is not ill-appearing.  HENT:     Right Ear: Tympanic membrane normal.     Left Ear: Tympanic membrane normal.     Nose: Congestion present.     Mouth/Throat:     Mouth: Mucous membranes are moist.     Pharynx: Oropharynx is clear.  Cardiovascular:     Rate and Rhythm: Normal rate and regular rhythm.     Heart sounds: Normal heart sounds.  Pulmonary:     Effort: Pulmonary effort is normal. No respiratory distress.     Breath sounds: Normal breath sounds. No wheezing, rhonchi or rales.  Musculoskeletal:     Cervical back: Neck supple.  Skin:    General: Skin is warm and dry.  Neurological:     Mental Status: She is alert.  Psychiatric:        Mood and Affect: Mood normal.        Behavior: Behavior normal.      UC Treatments / Results  Labs (all labs ordered are listed, but only abnormal results are displayed) Labs Reviewed - No data to display  EKG   Radiology No results found.  Procedures Procedures (including critical care time)  Medications Ordered in UC Medications - No data to display  Initial Impression / Assessment and Plan / UC Course  I have reviewed the triage vital signs and the nursing notes.  Pertinent labs & imaging results that were available during my care of the patient were reviewed by me and considered in my medical decision making (see chart for details).    Acute sinusitis.  Elevated blood pressure with hypertension.  Treating with amoxicillin.  Education provided on sinus infection.  Also discussed with patient that her blood pressure is elevated today and needs to be rechecked by PCP this week.  She reports she took her blood pressure medication this morning just prior to coming here.  Education provided on managing hypertension.  She  agrees to plan of care.    Final Clinical Impressions(s) / UC Diagnoses   Final diagnoses:  Acute non-recurrent maxillary sinusitis  Elevated blood pressure reading in office with diagnosis of hypertension     Discharge Instructions      Take the amoxicillin as directed.   Your blood pressure is elevated today at 164/100; repeat 170/102.  Please have this rechecked by your primary care provider this week.          ED Prescriptions     Medication Sig Dispense Auth. Provider   amoxicillin (AMOXIL) 875 MG tablet Take 1 tablet (875 mg total) by mouth 2 (two) times daily for 7 days. 14 tablet Sharion Balloon, NP      PDMP not reviewed this encounter.   Sharion Balloon, NP 10/27/22 [phone]

## 2022-10-27 NOTE — Discharge Instructions (Addendum)
Take the amoxicillin as directed.   Your blood pressure is elevated today at 164/100; repeat 170/102.  Please have this rechecked by your primary care provider this week.

## 2022-10-28 NOTE — Progress Notes (Unsigned)
I,Sulibeya S Dimas,acting as a scribe for Lelon Huh, MD.,have documented all relevant documentation on the behalf of Lelon Huh, MD,as directed by  Lelon Huh, MD while in the presence of Lelon Huh, MD.     Established patient visit   Patient: Kara Murray   DOB: 1975/12/27   47 y.o. Female  MRN: [id number] Visit Date: 10/29/2022  Today's healthcare provider: Lelon Huh, MD   Chief Complaint  Patient presents with   Sinusitis   Subjective    HPI  Patient was seen at urgent care on 10/27/22 and is being treated for sinus infection with amoxicillin. She reports a lot a congestion, phlegm and headache. She reports taking NyQuil and Mucinex DM. Has not improved since starting antibiotic 2 days ago.  Patient not sure if medications are making her bp high. She does not have a bp monitor at home. She states that she was taken off amlodipine when she was in the hospital in November but doesn't know why.     Medications: Outpatient Medications Prior to Visit  Medication Sig   amoxicillin (AMOXIL) 875 MG tablet Take 1 tablet (875 mg total) by mouth 2 (two) times daily for 7 days.   atenolol (TENORMIN) 50 MG tablet TAKE 1 TABLET BY MOUTH EVERY DAY (Patient taking differently: Take 50 mg by mouth every morning.)   levonorgestrel (MIRENA) 20 MCG/24HR IUD 1 Intra Uterine Device (1 each total) by Intrauterine route once for 1 dose.   PARoxetine (PAXIL) 20 MG tablet Take 1 tablet (20 mg total) by mouth daily. (Patient taking differently: Take 20 mg by mouth every morning.)   traMADol (ULTRAM) 50 MG tablet Take 1 tablet (50 mg total) by mouth every 6 (six) hours as needed for moderate pain.   amLODipine (NORVASC) 5 MG tablet Take 5 mg by mouth daily. (Patient not taking: Reported on 10/29/2022)   colchicine 0.6 MG tablet Take 0.6 mg by mouth as needed. (Patient not taking: Reported on 10/29/2022)   enoxaparin (LOVENOX) 40 MG/0.4ML injection Inject 0.4 mLs (40 mg total)  into the skin daily for 14 days.   [DISCONTINUED] acetaminophen (TYLENOL) 500 MG tablet Take 2 tablets (1,000 mg total) by mouth every 8 (eight) hours. (Patient not taking: Reported on 10/29/2022)   [DISCONTINUED] docusate sodium (COLACE) 100 MG capsule Take 1 capsule (100 mg total) by mouth 2 (two) times daily. (Patient not taking: Reported on 10/29/2022)   [DISCONTINUED] ondansetron (ZOFRAN) 4 MG tablet Take 1 tablet (4 mg total) by mouth every 6 (six) hours as needed for nausea. (Patient not taking: Reported on 10/29/2022)   No facility-administered medications prior to visit.    Review of Systems  Constitutional:  Negative for chills and fever.  HENT:  Positive for congestion, ear pain, postnasal drip, sinus pressure and sinus pain.   Eyes:  Positive for visual disturbance.  Respiratory:  Positive for cough and shortness of breath. Negative for chest tightness and wheezing.   Cardiovascular:  Negative for chest pain and leg swelling.  Gastrointestinal:  Negative for abdominal pain, nausea and vomiting.  Neurological:  Positive for headaches.       Objective    BP (!) 161/84 (BP Location: Left Arm, Patient Position: Sitting, Cuff Size: Large)   Pulse 81   Temp 97.7 F (36.5 C) (Temporal)   Resp 20   Wt 215 lb (97.5 kg)   SpO2 99%   BMI 39.32 kg/m    Physical Exam  General Appearance:    Obese female,  alert, cooperative, in no acute distress  HENT:   bilateral TM normal without fluid or infection, neck without nodes, frontal and maxillary  sinus tender, and nasal mucosa congested  Eyes:    PERRL, slightly injected and tender on left EOM's intact       Lungs:     Clear to auscultation bilaterally, respirations unlabored  Heart:    Normal heart rate. Normal rhythm. No murmurs, rubs, or gallops.    Neurologic:   Awake, alert, oriented x 3. No apparent focal neurological           defect.         Assessment & Plan     1. Sinusitis, unspecified chronicity, unspecified  location No improvement yet since starting amoxicillin 2 days ago. add fluticasone (FLONASE) 50 MCG/ACT nasal spray; Place 2 sprays into both nostrils daily.  Dispense: 16 g; Refill: 6  2. Conjunctivitis of left eye, unspecified conjunctivitis type  - ciprofloxacin (CILOXAN) 0.3 % ophthalmic solution; Place 1 drop into both eyes every 4 (four) hours while awake for 7 days.  Dispense: 5 mL; Refill: 0  3. Primary hypertension Start back on amlodipine, continue atenolol, follow up in 4 weeks for BP check.       The entirety of the information documented in the History of Present Illness, Review of Systems and Physical Exam were personally obtained by me. Portions of this information were initially documented by the CMA and reviewed by me for thoroughness and accuracy.     Lelon Huh, MD  [place] [phone] (phone) [phone] (fax)  [place]

## 2022-10-29 ENCOUNTER — Ambulatory Visit: Payer: BC Managed Care – PPO | Admitting: Family Medicine

## 2022-10-29 ENCOUNTER — Encounter: Payer: Self-pay | Admitting: Family Medicine

## 2022-10-29 VITALS — BP 161/84 | HR 81 | Temp 97.7°F | Resp 20 | Wt 215.0 lb

## 2022-10-29 DIAGNOSIS — I10 Essential (primary) hypertension: Secondary | ICD-10-CM

## 2022-10-29 DIAGNOSIS — H109 Unspecified conjunctivitis: Secondary | ICD-10-CM

## 2022-10-29 DIAGNOSIS — J329 Chronic sinusitis, unspecified: Secondary | ICD-10-CM

## 2022-10-29 MED ORDER — FLUTICASONE PROPIONATE 50 MCG/ACT NA SUSP: 2.0000 | Freq: Every day | NASAL | 6 refills | Status: AC

## 2022-10-29 MED ORDER — CIPROFLOXACIN HCL 0.3 % OP SOLN: 1.0000 [drp] | OPHTHALMIC | 0 refills | Status: AC

## 2022-10-29 NOTE — Patient Instructions (Signed)
.   Please review the attached list of medications and notify my office if there are any errors.   . Please bring all of your medications to every appointment so we can make sure that our medication list is the same as yours.   

## 2022-11-19 ENCOUNTER — Ambulatory Visit: Payer: BC Managed Care – PPO | Admitting: Family Medicine

## 2022-11-19 VITALS — BP 153/89 | HR 77 | Temp 98.4°F | Wt 211.0 lb

## 2022-11-19 DIAGNOSIS — I10 Essential (primary) hypertension: Secondary | ICD-10-CM | POA: Diagnosis not present

## 2022-11-19 MED ORDER — TRAZODONE HCL 50 MG PO TABS: 50.0000 mg | ORAL_TABLET | Freq: Every day | ORAL | 1 refills | Status: DC

## 2022-11-19 MED ORDER — AMLODIPINE BESYLATE 10 MG PO TABS: 10.0000 mg | ORAL_TABLET | Freq: Every day | ORAL | 1 refills | Status: DC

## 2022-11-19 NOTE — Progress Notes (Signed)
Established patient visit   Patient: Kara Murray   DOB: 1975-10-16   47 y.o. Female  MRN: [id number] Visit Date: 11/19/2022  Today's healthcare provider: Lelon Huh, MD   Chief Complaint  Patient presents with   Hypertension   Subjective    HPI  Hypertension, follow-up  BP Readings from Last 3 Encounters:  11/19/22 (!) 153/89  10/29/22 (!) 161/84  10/27/22 (!) 170/102   Wt Readings from Last 3 Encounters:  11/19/22 211 lb (95.7 kg)  10/29/22 215 lb (97.5 kg)  08/14/22 214 lb 4.6 oz (97.2 kg)     She was last seen for hypertension 3 weeks ago. Management since that visit includes starting back on amlodipine '5mg'$   She reports good compliance with treatment. She is not having side effects.  She is following a Regular diet. She is exercising. She does not smoke.  Use of agents associated with hypertension: none.   Outside blood pressures are not being checked. Symptoms: No chest pain No chest pressure  No palpitations No syncope  No dyspnea No orthopnea  No paroxysmal nocturnal dyspnea No lower extremity edema   Pertinent labs Lab Results  Component Value Date   CHOL 183 01/08/2022   HDL 70 01/08/2022   LDLCALC 72 01/08/2022   TRIG 256 (H) 01/08/2022   CHOLHDL 2.6 01/08/2022   Lab Results  Component Value Date   NA 136 08/15/2022   K 3.7 08/15/2022   CREATININE 0.61 08/15/2022   GFRNONAA >60 08/15/2022   GLUCOSE 118 (H) 08/15/2022   TSH 1.210 01/01/2021     The 10-year ASCVD risk score (Arnett DK, et al., 2019) is: 4%  ---------------------------------------------------------------------------------------------------   Medications: Outpatient Medications Prior to Visit  Medication Sig   amLODipine (NORVASC) 5 MG tablet Take 5 mg by mouth daily.   atenolol (TENORMIN) 50 MG tablet TAKE 1 TABLET BY MOUTH EVERY DAY (Patient taking differently: Take 50 mg by mouth every morning.)   PARoxetine (PAXIL) 20 MG tablet Take 1 tablet (20 mg  total) by mouth daily. (Patient taking differently: Take 20 mg by mouth every morning.)   colchicine 0.6 MG tablet Take 0.6 mg by mouth as needed. (Patient not taking: Reported on 10/29/2022)   enoxaparin (LOVENOX) 40 MG/0.4ML injection Inject 0.4 mLs (40 mg total) into the skin daily for 14 days.   fluticasone (FLONASE) 50 MCG/ACT nasal spray Place 2 sprays into both nostrils daily. (Patient not taking: Reported on 11/19/2022)   levonorgestrel (MIRENA) 20 MCG/24HR IUD 1 Intra Uterine Device (1 each total) by Intrauterine route once for 1 dose.   traMADol (ULTRAM) 50 MG tablet Take 1 tablet (50 mg total) by mouth every 6 (six) hours as needed for moderate pain. (Patient not taking: Reported on 11/19/2022)   No facility-administered medications prior to visit.          Objective    BP (!) 153/89 (BP Location: Left Arm, Patient Position: Sitting, Cuff Size: Large)   Pulse 77   Temp 98.4 F (36.9 C) (Oral)   Wt 211 lb (95.7 kg)   SpO2 100%   BMI 38.59 kg/m    Physical Exam   General appearance: Obese female, cooperative and in no acute distress Head: Normocephalic, without obvious abnormality, atraumatic Respiratory: Respirations even and unlabored, normal respiratory rate Extremities: All extremities are intact.  Skin: Skin color, texture, turgor normal. No rashes seen  Psych: Appropriate mood and affect. Neurologic: Mental status: Alert, oriented to person, place, and time, thought  content appropriate.    Assessment & Plan     1. Primary hypertension Improved since starting back on amlodipine, but not near goal. Is working on losing weight. Will increase from '5mg'$  to amLODipine (NORVASC) 10 MG tablet; Take 1 tablet (10 mg total) by mouth daily.  Dispense: 90 tablet; Refill: 1  2. Insomnia She states she has tried OTC melatonin which hasn't helped, and would like to try something else.  Prescription traZODone (DESYREL) 50 MG tablet; Take 1-2 tablets (50-100 mg total) by mouth at  bedtime.  Dispense: 60 tablet; Refill: 1      The entirety of the information documented in the History of Present Illness, Review of Systems and Physical Exam were personally obtained by me. Portions of this information were initially documented by the CMA and reviewed by me for thoroughness and accuracy.    Future Appointments  Date Time Provider [place]  03/06/2023  3:40 PM Claris Pech, Kirstie Peri, MD [facility]      Lelon Huh, MD  [facility] [phone] (phone) [phone] (fax)  [place]

## 2022-11-19 NOTE — Patient Instructions (Signed)
.   Please review the attached list of medications and notify my office if there are any errors.   . Please bring all of your medications to every appointment so we can make sure that our medication list is the same as yours.   

## 2022-12-05 ENCOUNTER — Ambulatory Visit: Payer: BC Managed Care – PPO | Admitting: Family Medicine

## 2022-12-05 VITALS — BP 152/87 | HR 76 | Ht 62.0 in | Wt 217.6 lb

## 2022-12-05 DIAGNOSIS — I10 Essential (primary) hypertension: Secondary | ICD-10-CM | POA: Diagnosis not present

## 2022-12-05 MED ORDER — VALSARTAN-HYDROCHLOROTHIAZIDE 80-12.5 MG PO TABS: 1.0000 | ORAL_TABLET | Freq: Every day | ORAL | 1 refills | Status: DC

## 2022-12-05 NOTE — Progress Notes (Signed)
I,Sha'taria Tyson,acting as a Education administrator for Lelon Huh, MD.,have documented all relevant documentation on the behalf of Lelon Huh, MD,as directed by  Lelon Huh, MD while in the presence of Lelon Huh, MD.   Established patient visit   Patient: Kara Murray   DOB: Mar 02, 1976   47 y.o. Female  MRN: [id number] Visit Date: 12/05/2022  Today's healthcare provider: Lelon Huh, MD    Subjective    HPI  -Patient reports she has also started having knee pain again Hypertension, follow-up  BP Readings from Last 3 Encounters:  11/19/22 (!) 153/89  10/29/22 (!) 161/84  10/27/22 (!) 170/102   Wt Readings from Last 3 Encounters:  11/19/22 211 lb (95.7 kg)  10/29/22 215 lb (97.5 kg)  08/14/22 214 lb 4.6 oz (97.2 kg)     She was last seen for hypertension 2 weeks ago.  BP at that visit was 153/89. Management since that visit includes increase from 5mg  to amLODipine (NORVASC) 10 MG tablet .  She reports excellent compliance with treatment. She is having side effects. She went back to the 5mg  dose a few days ago due to swelling in both legs, that is just now starting to improve.   Outside blood pressures are not being checked. Symptoms: No chest pain No chest pressure  No palpitations No syncope  No dyspnea No orthopnea  No paroxysmal nocturnal dyspnea Yes lower extremity edema   Pertinent labs Lab Results  Component Value Date   CHOL 183 01/08/2022   HDL 70 01/08/2022   LDLCALC 72 01/08/2022   TRIG 256 (H) 01/08/2022   CHOLHDL 2.6 01/08/2022   Lab Results  Component Value Date   NA 136 08/15/2022   K 3.7 08/15/2022   CREATININE 0.61 08/15/2022   GFRNONAA >60 08/15/2022   GLUCOSE 118 (H) 08/15/2022   TSH 1.210 01/01/2021     The 10-year ASCVD risk score (Arnett DK, et al., 2019) is: 4%  ---------------------------------------------------------------------------------------------------   Medications: Outpatient Medications Prior to Visit   Medication Sig   amLODipine (NORVASC) 10 MG tablet Take 1 tablet (10 mg total) by mouth daily. (Patient taking differently: Take 5 mg by mouth daily.)   atenolol (TENORMIN) 50 MG tablet TAKE 1 TABLET BY MOUTH EVERY DAY (Patient taking differently: Take 50 mg by mouth every morning.)   fluticasone (FLONASE) 50 MCG/ACT nasal spray Place 2 sprays into both nostrils daily.   PARoxetine (PAXIL) 20 MG tablet Take 1 tablet (20 mg total) by mouth daily. (Patient taking differently: Take 20 mg by mouth every morning.)   traZODone (DESYREL) 50 MG tablet Take 1-2 tablets (50-100 mg total) by mouth at bedtime.   levonorgestrel (MIRENA) 20 MCG/24HR IUD 1 Intra Uterine Device (1 each total) by Intrauterine route once for 1 dose.   traMADol (ULTRAM) 50 MG tablet Take 1 tablet (50 mg total) by mouth every 6 (six) hours as needed for moderate pain. (Patient not taking: Reported on 11/19/2022)   [DISCONTINUED] colchicine 0.6 MG tablet Take 0.6 mg by mouth as needed. (Patient not taking: Reported on 10/29/2022)   No facility-administered medications prior to visit.    Review of Systems  Constitutional:  Negative for appetite change, chills, fatigue and fever.  Respiratory:  Negative for chest tightness and shortness of breath.   Cardiovascular:  Negative for chest pain and palpitations.  Gastrointestinal:  Negative for abdominal pain, nausea and vomiting.  Neurological:  Negative for dizziness and weakness.       Objective  BP (!) 152/87 (BP Location: Left Arm, Patient Position: Sitting, Cuff Size: Large)   Pulse 76   Ht 5\' 2"  (1.575 m)   Wt 217 lb 9.6 oz (98.7 kg)   SpO2 100%   BMI 39.80 kg/m    Physical Exam  2+ edema both Les.   Assessment & Plan     1. Primary hypertension Did not tolerate 10mg  amlodipine due to edema. Will continue on 5 amolodine, 50 atenolol and addvalsartan-hydrochlorothiazide (DIOVAN-HCT) 80-12.5 MG tablet; Take 1 tablet by mouth daily.  Dispense: 30 tablet; Refill: 1    Return in about 3 weeks (around 12/26/2022).      The entirety of the information documented in the History of Present Illness, Review of Systems and Physical Exam were personally obtained by me. Portions of this information were initially documented by the CMA and reviewed by me for thoroughness and accuracy.     Lelon Huh, MD  [place] [phone] (phone) [phone] (fax)  [place]

## 2022-12-05 NOTE — Patient Instructions (Signed)
.   Please review the attached list of medications and notify my office if there are any errors.   . Please bring all of your medications to every appointment so we can make sure that our medication list is the same as yours.   

## 2022-12-12 ENCOUNTER — Other Ambulatory Visit: Payer: Self-pay | Admitting: Family Medicine

## 2022-12-16 ENCOUNTER — Other Ambulatory Visit: Payer: Self-pay | Admitting: Family Medicine

## 2022-12-16 DIAGNOSIS — F430 Acute stress reaction: Secondary | ICD-10-CM

## 2022-12-16 DIAGNOSIS — M1A0420 Idiopathic chronic gout, left hand, without tophus (tophi): Secondary | ICD-10-CM

## 2022-12-24 ENCOUNTER — Encounter: Payer: Self-pay | Admitting: Family Medicine

## 2022-12-24 ENCOUNTER — Ambulatory Visit (INDEPENDENT_AMBULATORY_CARE_PROVIDER_SITE_OTHER): Payer: BC Managed Care – PPO | Admitting: Family Medicine

## 2022-12-24 VITALS — BP 134/78 | HR 76 | Temp 97.9°F | Resp 16 | Ht 62.0 in | Wt 214.0 lb

## 2022-12-24 DIAGNOSIS — I10 Essential (primary) hypertension: Secondary | ICD-10-CM | POA: Diagnosis not present

## 2022-12-24 DIAGNOSIS — R739 Hyperglycemia, unspecified: Secondary | ICD-10-CM

## 2022-12-24 NOTE — Patient Instructions (Signed)
.   Please review the attached list of medications and notify my office if there are any errors.   . Please bring all of your medications to every appointment so we can make sure that our medication list is the same as yours.   . Please go to the lab draw station in [address] on the second floor of [facility]  when you are fasting for 8 hours. Normal hours are 8:00am to 11:30am and 1:00pm to 4:00pm Monday through Friday   

## 2022-12-24 NOTE — Progress Notes (Signed)
I,Sulibeya S Dimas,acting as a scribe for Mila Merry, MD.,have documented all relevant documentation on the behalf of Mila Merry, MD,as directed by  Mila Merry, MD while in the presence of Mila Merry, MD.     Established patient visit   Patient: Kara Murray   DOB: 10/25/1975   47 y.o. Female  MRN: [id number] Visit Date: 12/24/2022  Today's healthcare provider: Mila Merry, MD   Chief Complaint  Patient presents with   Hypertension   Subjective    HPI  Hypertension, follow-up  BP Readings from Last 3 Encounters:  12/24/22 134/78  12/05/22 (!) 152/87  11/19/22 (!) 153/89   Wt Readings from Last 3 Encounters:  12/24/22 214 lb (97.1 kg)  12/05/22 217 lb 9.6 oz (98.7 kg)  11/19/22 211 lb (95.7 kg)     She was last seen for hypertension 3 weeks ago.  BP at that visit was 152/87. Management since that visit includes continue Amlodipine 5 mg. Atenolol 50 mg. Add Valsartan-HCTZ 80-12.5 mg daily.  She reports excellent compliance with treatment. She is having side effects. Light-headedness if she doesn't eat before taking medication.   Outside blood pressures are not being checked.   Pertinent labs Lab Results  Component Value Date   CHOL 183 01/08/2022   HDL 70 01/08/2022   LDLCALC 72 01/08/2022   TRIG 256 (H) 01/08/2022   CHOLHDL 2.6 01/08/2022   Lab Results  Component Value Date   NA 136 08/15/2022   K 3.7 08/15/2022   CREATININE 0.61 08/15/2022   GFRNONAA >60 08/15/2022   GLUCOSE 118 (H) 08/15/2022   TSH 1.210 01/01/2021     The 10-year ASCVD risk score (Arnett DK, et al., 2019) is: 2.3%  ---------------------------------------------------------------------------------------------------   Medications: Outpatient Medications Prior to Visit  Medication Sig   allopurinol (ZYLOPRIM) 100 MG tablet TAKE 1 TABLET BY MOUTH EVERY DAY   amLODipine (NORVASC) 5 MG tablet Take 5 mg by mouth daily.   atenolol (TENORMIN) 50 MG tablet TAKE 1  TABLET BY MOUTH EVERY DAY   fluticasone (FLONASE) 50 MCG/ACT nasal spray Place 2 sprays into both nostrils daily.   PARoxetine (PAXIL) 20 MG tablet TAKE 1 TABLET BY MOUTH EVERY DAY   traZODone (DESYREL) 50 MG tablet Take 1-2 tablets (50-100 mg total) by mouth at bedtime.   valsartan-hydrochlorothiazide (DIOVAN-HCT) 80-12.5 MG tablet Take 1 tablet by mouth daily.   levonorgestrel (MIRENA) 20 MCG/24HR IUD 1 Intra Uterine Device (1 each total) by Intrauterine route once for 1 dose.   No facility-administered medications prior to visit.    Review of Systems  Constitutional:  Negative for fatigue.  Eyes:  Negative for visual disturbance.  Respiratory:  Negative for cough, chest tightness and shortness of breath.   Cardiovascular:  Positive for leg swelling. Negative for chest pain and palpitations.  Gastrointestinal:  Positive for nausea. Negative for abdominal pain and vomiting.  Neurological:  Positive for light-headedness. Negative for dizziness and headaches.        Objective    BP 134/78 (BP Location: Left Arm, Patient Position: Sitting, Cuff Size: Large)   Pulse 76   Temp 97.9 F (36.6 C) (Temporal)   Resp 16   Ht  (1.575 m)   Wt 214 lb (97.1 kg)   SpO2 100%   BMI 39.14 kg/m  BP Readings from Last 3 Encounters:  12/24/22 134/78  12/05/22 (!) 152/87  11/19/22 (!) 153/89   Wt Readings from Last 3 Encounters:  12/24/22 214 lb (97.1 kg)  12/05/22 217 lb 9.6 oz (98.7 kg)  11/19/22 211 lb (95.7 kg)    Physical Exam  General appearance: Mildly obese female, cooperative and in no acute distress Head: Normocephalic, without obvious abnormality, atraumatic Respiratory: Respirations even and unlabored, normal respiratory rate Extremities: All extremities are intact.  Skin: Skin color, texture, turgor normal. No rashes seen  Psych: Appropriate mood and affect. Neurologic: Mental status: Alert, oriented to person, place, and time, thought content appropriate.     Assessment & Plan     1. Primary hypertension Much better controlled since adding valsartan-hctz to  amlodipine. Had some dizziness at first, but now tolerating well so long as she eats before taking medication. Continue current medications.   - Renal function panel - Lipid panel  2. Hyperglycemia 118 on 08/15/2022  - Hemoglobin A1c  Future Appointments  Date Time Provider Department Center  03/06/2023  3:40 PM [name], MD [facility]  06/15/2023  8:20 AM Sherrie Mustache Demetrios Isaacs, MD [facility]        The entirety of the information documented in the History of Present Illness, Review of Systems and Physical Exam were personally obtained by me. Portions of this information were initially documented by the CMA and reviewed by me for thoroughness and accuracy.     Mila Merry, MD  [facility] [phone] (phone) [phone] (fax)  [facility]

## 2022-12-28 ENCOUNTER — Other Ambulatory Visit: Payer: Self-pay | Admitting: Family Medicine

## 2022-12-28 DIAGNOSIS — I10 Essential (primary) hypertension: Secondary | ICD-10-CM

## 2023-01-03 LAB — LIPID PANEL
Chol/HDL Ratio: 1.9 ratio (ref 0.0–4.4)
Cholesterol, Total: 203 mg/dL — ABNORMAL HIGH (ref 100–199)
HDL: 106 mg/dL (ref >39)
LDL Chol Calc (NIH): 80 mg/dL (ref 0–99)
Triglycerides: 102 mg/dL (ref 0–149)
VLDL Cholesterol Cal: 17 mg/dL (ref 5–40)

## 2023-01-03 LAB — RENAL FUNCTION PANEL
Albumin: 3.9 g/dL (ref 3.9–4.9)
BUN/Creatinine Ratio: 8 — ABNORMAL LOW (ref 9–23)
BUN: 5 mg/dL — ABNORMAL LOW (ref 6–24)
CO2: 19 mmol/L — ABNORMAL LOW (ref 20–29)
Calcium: 8.6 mg/dL — ABNORMAL LOW (ref 8.7–10.2)
Chloride: 102 mmol/L (ref 96–106)
Creatinine, Ser: 0.6 mg/dL (ref 0.57–1.00)
Glucose: 84 mg/dL (ref 70–99)
Phosphorus: 4 mg/dL (ref 3.0–4.3)
Potassium: 4.8 mmol/L (ref 3.5–5.2)
Sodium: 136 mmol/L (ref 134–144)
eGFR: 111 mL/min/{1.73_m2} (ref >59)

## 2023-01-03 LAB — HEMOGLOBIN A1C
Est. average glucose Bld gHb Est-mCnc: 111 mg/dL
Hgb A1c MFr Bld: 5.5 % (ref 4.8–5.6)

## 2023-01-17 ENCOUNTER — Other Ambulatory Visit: Payer: Self-pay | Admitting: Family Medicine

## 2023-01-26 ENCOUNTER — Ambulatory Visit: Payer: Self-pay | Admitting: *Deleted

## 2023-01-26 ENCOUNTER — Inpatient Hospital Stay: Payer: BC Managed Care – PPO | Admitting: Family Medicine

## 2023-01-26 ENCOUNTER — Ambulatory Visit: Payer: BC Managed Care – PPO | Admitting: Family Medicine

## 2023-01-26 ENCOUNTER — Encounter: Payer: Self-pay | Admitting: Family Medicine

## 2023-01-26 VITALS — BP 122/79 | HR 78 | Ht 62.0 in | Wt 217.0 lb

## 2023-01-26 DIAGNOSIS — M898X1 Other specified disorders of bone, shoulder: Secondary | ICD-10-CM | POA: Diagnosis not present

## 2023-01-26 MED ORDER — PREDNISONE 10 MG PO TABS: ORAL_TABLET | ORAL | 0 refills | Status: AC

## 2023-01-26 MED ORDER — METHOCARBAMOL 500 MG PO TABS: 500.0000 mg | ORAL_TABLET | Freq: Three times a day (TID) | ORAL | 0 refills | Status: DC

## 2023-01-26 NOTE — Telephone Encounter (Signed)
Reason for Disposition  [1] MODERATE pain (e.g., interferes with normal activities) AND [2] present > 3 days  Answer Assessment - Initial Assessment Questions 1. ONSET: "When did the pain start?"     Pain in left arm and shoulder into my back.   I went to [facility] ED on [address].  Because I thought I might be having a heart attack because of the left arm pain.    They did a work up for possible heart attack.    Everything was fine.   They wanted me to do a follow up with my dr.     Quincy Carnes forgot the rx.   I called the ED and I don't know if they called it in or not for the pain medicine. They said I have arthritis or something.  2. LOCATION: "Where is the pain located?"     Left elbow up to my shoulder and upper back. 3. PAIN: "How bad is the pain?" (Scale 1-10; or mild, moderate, severe)   - MILD (1-3): Doesn't interfere with normal activities.   - MODERATE (4-7): Interferes with normal activities (e.g., work or school) or awakens from sleep.   - SEVERE (8-10): Excruciating pain, unable to do any normal activities, unable to hold a cup of water.     Severe pain    They forgot to give me the rx for pain. 4. WORK OR EXERCISE: "Has there been any recent work or exercise that involved this part of the body?"     No.    I dislocated this shoulder in college years ago.    I've had a thumb replacement 2 yrs ago. 5. CAUSE: "What do you think is causing the arm pain?"     I don't know 6. OTHER SYMPTOMS: "Do you have any other symptoms?" (e.g., neck pain, swelling, rash, fever, numbness, weakness)     No 7. PREGNANCY: "Is there any chance you are pregnant?" "When was your last menstrual period?"     Not asked  Protocols used: Arm Pain-A-AH

## 2023-01-26 NOTE — Telephone Encounter (Signed)
  Chief Complaint: Left arm pain into shoulder and upper back.   Seen at [facility] ED over the weekend.   Heart attack ruled out. Symptoms: above Frequency: constant pain Pertinent Negatives: Patient denies injuries.    Dislocated that shoulder back in college other wise no injuries since then. Disposition: [] ED /[] Urgent Care (no appt availability in office) / [x] Appointment(In office/virtual)/ []  [place] [facility]/ [] Home Care/ [] Refused Recommended Disposition /[] [facility]/ []  Follow-up with PCP Additional Notes: Appt made for today with Dr. Sherrie Mustache at 1:40.

## 2023-01-26 NOTE — Progress Notes (Signed)
      Established patient visit   Patient: Kara Murray   DOB: 13-Dec-1975   47 y.o. Female  MRN: [id number] Visit Date: 01/26/2023  Today's healthcare provider: Mila Merry, MD    Subjective    HPI  Labs remarkable for slightly low sodium of 133 and microcytic anemia with hemoglobin of 10.1.  Normal x-rays of left arm, shoulder and chest, negative troponin. No c-spine x-rays done. She was given oral Robaxin and 15mg  IM ketorolac in ER with minimal relief. There was no associated injury, but   Medications: Outpatient Medications Prior to Visit  Medication Sig   allopurinol (ZYLOPRIM) 100 MG tablet TAKE 1 TABLET BY MOUTH EVERY DAY   amLODipine (NORVASC) 5 MG tablet Take 5 mg by mouth daily.   atenolol (TENORMIN) 50 MG tablet TAKE 1 TABLET BY MOUTH EVERY DAY   fluticasone (FLONASE) 50 MCG/ACT nasal spray Place 2 sprays into both nostrils daily.   levonorgestrel (MIRENA) 20 MCG/24HR IUD 1 Intra Uterine Device (1 each total) by Intrauterine route once for 1 dose.   PARoxetine (PAXIL) 20 MG tablet TAKE 1 TABLET BY MOUTH EVERY DAY   traZODone (DESYREL) 50 MG tablet TAKE 1-2 TABLETS BY MOUTH AT BEDTIME.   valsartan-hydrochlorothiazide (DIOVAN-HCT) 80-12.5 MG tablet TAKE 1 TABLET BY MOUTH EVERY DAY   No facility-administered medications prior to visit.    Review of Systems     Objective    BP 122/79 (BP Location: Right Arm, Patient Position: Sitting, Cuff Size: Normal)   Pulse 78   Ht 5\' 2"  (1.575 m)   Wt 217 lb (98.4 kg)   SpO2 97%   BMI 39.69 kg/m    Physical Exam   General: Appearance:    Mildly obese female in no acute distress  Eyes:    PERRL, conjunctiva/corneas clear, EOM's intact       Lungs:     Clear to auscultation bilaterally, respirations unlabored  Heart:    Normal heart rate. Normal rhythm. No murmurs, rubs, or gallops.    MS:   All extremities are intact.  No spine tenderness. Slightly swollen over left trapezius. Point tenderness of left scapula  and left supero-lateral trapezius. Mildly tenderness along course of left shoulder and upper arm.         Assessment & Plan     1. Pain of left scapula Likely strain of trapezius and radiating into shoulder and arm.   - predniSONE (DELTASONE) 10 MG tablet; 6 tablets for 2 days, then 5 for 2 days, then 4 for 2 days, then 3 for 2 days, then 2 for 2 days, then 1 for 2 days.  Dispense: 42 tablet; Refill: 0 - methocarbamol (ROBAXIN) 500 MG tablet; Take 1-2 tablets (500-1,000 mg total) by mouth 3 (three) times daily.  Dispense: 30 tablet; Refill: 0         Mila Merry, MD  [facility] [phone] (phone) [phone] (fax)  [facility]

## 2023-02-09 ENCOUNTER — Ambulatory Visit: Payer: Self-pay

## 2023-02-09 ENCOUNTER — Encounter: Payer: Self-pay | Admitting: Family Medicine

## 2023-02-09 ENCOUNTER — Ambulatory Visit: Payer: BC Managed Care – PPO | Admitting: Family Medicine

## 2023-02-09 VITALS — BP 147/85 | HR 76 | Ht 62.0 in | Wt 218.0 lb

## 2023-02-09 DIAGNOSIS — M898X1 Other specified disorders of bone, shoulder: Secondary | ICD-10-CM

## 2023-02-09 MED ORDER — PREDNISONE 10 MG PO TABS: 10.0000 mg | ORAL_TABLET | Freq: Every day | ORAL | 0 refills | Status: AC

## 2023-02-09 NOTE — Progress Notes (Signed)
      Established patient visit   Patient: Kara Murray   DOB: 1976-02-14   47 y.o. Female  MRN: [id number] Visit Date: 02/09/2023  Today's healthcare provider: Mila Merry, MD   Chief Complaint  Patient presents with   Follow-up    Left arm --doing ok w/ the meds., but still painful.   Subjective    HPI  Here today to follow up on left scapular pain for which she first seen on 01/26/2023 and prescribed 12 day prednisone taper and methocarbamol. She states she weaned prednisone down over 6 days instead of 12, so she had several left over and continued to 1 tablet tablet at night along with a methocarbamol for several more days which was working very well. She states that since running out of prednisone that pain has been keeping her up at night again, but is not bother her during the day.  Medications: Outpatient Medications Prior to Visit  Medication Sig   allopurinol (ZYLOPRIM) 100 MG tablet TAKE 1 TABLET BY MOUTH EVERY DAY   amLODipine (NORVASC) 5 MG tablet Take 5 mg by mouth daily.   atenolol (TENORMIN) 50 MG tablet TAKE 1 TABLET BY MOUTH EVERY DAY   fluticasone (FLONASE) 50 MCG/ACT nasal spray Place 2 sprays into both nostrils daily.   methocarbamol (ROBAXIN) 500 MG tablet Take 1-2 tablets (500-1,000 mg total) by mouth 3 (three) times daily.   PARoxetine (PAXIL) 20 MG tablet TAKE 1 TABLET BY MOUTH EVERY DAY   traZODone (DESYREL) 50 MG tablet TAKE 1-2 TABLETS BY MOUTH AT BEDTIME.   valsartan-hydrochlorothiazide (DIOVAN-HCT) 80-12.5 MG tablet TAKE 1 TABLET BY MOUTH EVERY DAY   levonorgestrel (MIRENA) 20 MCG/24HR IUD 1 Intra Uterine Device (1 each total) by Intrauterine route once for 1 dose.   No facility-administered medications prior to visit.        Objective    BP (!) 147/85 (BP Location: Right Arm, Patient Position: Sitting, Cuff Size: Large)   Pulse 76   Ht 5\' 2"  (1.575 m)   Wt 218 lb (98.9 kg)   SpO2 100%   BMI 39.87 kg/m    Physical Exam  Tender  over left scapula but improved from office visit of 01/26/2023   Assessment & Plan     1. Pain of left scapula Daytime pain is much better since finishing prednisone taper, but has started keeping her up at night again since finishing prednisone. Can continue methocarbamol qhs and start back on predniSONE (DELTASONE) 10 MG tablet; Take 1 tablet (10 mg total) by mouth at bedtime.  Dispense: 30 tablet; Refill: 0   She is to call for orthopedic referral if not resolved in 2-3 weeks.         Mila Merry, MD  [facility] [phone] (phone) [phone] (fax)  [facility]

## 2023-02-09 NOTE — Telephone Encounter (Signed)
Summary: Requesting Rx for pain   Pt stated she needs a refill on the medication predniSONE (DELTASONE) 10 MG tablet. She stated that she is still having pain on her left elbow all the way to her upper back. Stated her pain is a 2-10 right at this moment however, the medication helps her sleep and without it her pain is severe.  Seeking clinical advice.      Called pt - left message on machine to return call.

## 2023-02-09 NOTE — Telephone Encounter (Signed)
  Chief Complaint: Left arm pain is continuing Symptoms: pain Frequency: prior to 5/20 Pertinent Negatives: Patient denies  Disposition: [] ED /[] Urgent Care (no appt availability in office) / [x] Appointment(In office/virtual)/ []  [place] [facility]/ [] Home Care/ [] Refused Recommended Disposition /[] [place] [facility]/ []  Follow-up with PCP Additional Notes: Pt called for a refill of prednisone. She states that this is the only thing that takes her pain away so she can sleep. Pt would also like to ascertain the cause of pain and have it resolved. Appt made for this afternoon.    Summary: Requesting Rx for pain   Pt stated she needs a refill on the medication predniSONE (DELTASONE) 10 MG tablet. She stated that she is still having pain on her left elbow all the way to her upper back. Stated her pain is a 2-10 right at this moment however, the medication helps her sleep and without it her pain is severe.  Seeking clinical advice.     Reason for Disposition  [1] MODERATE pain (e.g., interferes with normal activities) AND [2] present > 3 days  Answer Assessment - Initial Assessment Questions 1. DRUG NAME: "What medicine do you need to have refilled?"     Prednisone 2. REFILLS REMAINING: "How many refills are remaining?" (Note: The label on the medicine or pill bottle will show how many refills are remaining. If there are no refills remaining, then a renewal may be needed.)     none 4. PRESCRIBING HCP: "Who prescribed it?" Reason: If prescribed by specialist, call should be referred to that group.     Dr. Sherrie Mustache 5. SYMPTOMS: "Do you have any symptoms?"     Pain  Answer Assessment - Initial Assessment Questions 1. ONSET: "When did the pain start?"     Already seen for this issue 2. LOCATION: "Where is the pain located?"     Left arm 3. PAIN: "How bad is the pain?" (Scale 1-10; or mild, moderate, severe)   - MILD (1-3): Doesn't interfere with normal activities.   - MODERATE  (4-7): Interferes with normal activities (e.g., work or school) or awakens from sleep.   - SEVERE (8-10): Excruciating pain, unable to do any normal activities, unable to hold a cup of water.     Moderate  Protocols used: Medication Refill and Renewal Call-A-AH, Arm Pain-A-AH

## 2023-02-10 ENCOUNTER — Other Ambulatory Visit: Payer: Self-pay

## 2023-02-10 ENCOUNTER — Telehealth: Payer: Self-pay | Admitting: Family Medicine

## 2023-02-10 DIAGNOSIS — I10 Essential (primary) hypertension: Secondary | ICD-10-CM

## 2023-02-10 MED ORDER — AMLODIPINE BESYLATE 5 MG PO TABS: 5.0000 mg | ORAL_TABLET | Freq: Every day | ORAL | 0 refills | Status: DC

## 2023-02-10 NOTE — Telephone Encounter (Signed)
[facility] is requesting prescription refill amLODipine (NORVASC) 5 MG tablet   Please advise

## 2023-02-17 ENCOUNTER — Other Ambulatory Visit: Payer: Self-pay | Admitting: Family Medicine

## 2023-03-01 NOTE — Progress Notes (Deleted)
PCP:  [name], MD   No chief complaint on file.    HPI:      [name] is a 47 y.o. [id number] who LMP was No LMP recorded. (Menstrual status: IUD)., presents today for her NP> 3 yrs annual examination.  Her menses are infrequent with IUD, lasting 1 days, light flow with wiping only.  Dysmenorrhea none. She does have vasomotor sx.  Sex activity: single partner, contraception - IUD. Mirena REplaced 01/06/18.  Last Pap: 11/30/17  Results were: no abnormalities /neg HPV DNA  Hx of STDs: chlamydia in college  Last mammogram: December 23, 2012  Results were: normal--routine follow-up in 12 months.  There is a FH of breast cancer in her mat aunt and cousin, genetic testing not indicated. There is no FH of ovarian cancer. The patient does do self-breast exams.  Tobacco use: The patient denies current or previous tobacco use. Alcohol use: social drinker No drug use.  Exercise: min active  She does get adequate calcium but not Vitamin D in her diet.  Labs with PCP.   Past Medical History:  Diagnosis Date   Abnormal LFTs    Alcohol abuse    Anemia    Arthritis    Gout    Hypertension    Hypokalemia    Hyponatremia    Marijuana abuse    Sickle cell trait [facility])     Past Surgical History:  Procedure Laterality Date   biariactric surgery  09/08/2008   KNEE SURGERY Right 1994, 2001   THUMB FUSION Left 04/11/2014   TOE SURGERY  09/08/2006   TOTAL KNEE ARTHROPLASTY Right 08/14/2022   Procedure: TOTAL KNEE ARTHROPLASTY;  Surgeon: Reinaldo Berber, MD;  Location: [facility] ORS;  Service: Orthopedics;  Laterality: Right;    Family History  Problem Relation Age of Onset   Hypertension Mother    Diabetes Mother    Heart attack Father    Hypertension Brother    Breast cancer Maternal Aunt 37   Breast cancer Cousin     Social History   Socioeconomic History   Marital status: Widowed    Spouse name: Not on file   Number of children: Not on file   Years of  education: Not on file   Highest education level: Not on file  Occupational History   Not on file  Tobacco Use   Smoking status: Former    Types: Cigarettes    Quit date: 2019    Years since quitting: 5.4   Smokeless tobacco: Never   Tobacco comments:    socially  Vaping Use   Vaping Use: Never used  Substance and Sexual Activity   Alcohol use: Yes    Alcohol/week: 2.0 standard drinks of alcohol    Types: 2 Cans of beer per week    Comment: socially    Drug use: Yes    Types: Marijuana   Sexual activity: Not Currently    Birth control/protection: I.U.D.    Comment: Mirena   Other Topics Concern   Not on file  Social History Narrative   Not on file   Social Determinants of Health   Financial Resource Strain: Not on file  Food Insecurity: No Food Insecurity (08/14/2022)   Hunger Vital Sign    Worried About Running Out of Food in the Last Year: Never true    Ran Out of Food in the Last Year: Never true  Transportation Needs: No Transportation Needs (08/14/2022)   PRAPARE - Transportation  Lack of Transportation (Medical): No    Lack of Transportation (Non-Medical): No  Physical Activity: Not on file  Stress: Not on file  Social Connections: Not on file  Intimate Partner Violence: Not At Risk (08/14/2022)   Humiliation, Afraid, Rape, and Kick questionnaire    Fear of Current or Ex-Partner: No    Emotionally Abused: No    Physically Abused: No    Sexually Abused: No    Outpatient Medications Prior to Visit  Medication Sig Dispense Refill   allopurinol (ZYLOPRIM) 100 MG tablet TAKE 1 TABLET BY MOUTH EVERY DAY 90 tablet 0   amLODipine (NORVASC) 5 MG tablet Take 1 tablet (5 mg total) by mouth daily. 90 tablet 0   atenolol (TENORMIN) 50 MG tablet TAKE 1 TABLET BY MOUTH EVERY DAY 90 tablet 0   fluticasone (FLONASE) 50 MCG/ACT nasal spray Place 2 sprays into both nostrils daily. 16 g 6   levonorgestrel (MIRENA) 20 MCG/24HR IUD 1 Intra Uterine Device (1 each total) by  Intrauterine route once for 1 dose. 1 each 0   methocarbamol (ROBAXIN) 500 MG tablet Take 1-2 tablets (500-1,000 mg total) by mouth 3 (three) times daily. 30 tablet 0   PARoxetine (PAXIL) 20 MG tablet TAKE 1 TABLET BY MOUTH EVERY DAY 90 tablet 0   predniSONE (DELTASONE) 10 MG tablet Take 1 tablet (10 mg total) by mouth at bedtime. 30 tablet 0   traZODone (DESYREL) 50 MG tablet TAKE 1 TO 2 TABLETS BY MOUTH AT BEDTIME 180 tablet 0   valsartan-hydrochlorothiazide (DIOVAN-HCT) 80-12.5 MG tablet TAKE 1 TABLET BY MOUTH EVERY DAY 90 tablet 1   No facility-administered medications prior to visit.      ROS:  Review of Systems  Constitutional:  Negative for fatigue, fever and unexpected weight change.  Respiratory:  Negative for cough, shortness of breath and wheezing.   Cardiovascular:  Negative for chest pain, palpitations and leg swelling.  Gastrointestinal:  Negative for blood in stool, constipation, diarrhea, nausea and vomiting.  Endocrine: Negative for cold intolerance, heat intolerance and polyuria.  Genitourinary:  Negative for dyspareunia, dysuria, flank pain, frequency, genital sores, hematuria, menstrual problem, pelvic pain, urgency, vaginal bleeding, vaginal discharge and vaginal pain.  Musculoskeletal:  Negative for back pain, joint swelling and myalgias.  Skin:  Negative for rash.  Neurological:  Negative for dizziness, syncope, light-headedness, numbness and headaches.  Hematological:  Negative for adenopathy.  Psychiatric/Behavioral:  Negative for agitation, confusion, sleep disturbance and suicidal ideas. The patient is not nervous/anxious.    BREAST: No symptoms   Objective: There were no vitals taken for this visit.   Physical Exam Constitutional:      Appearance: She is well-developed.  Genitourinary:     Vulva normal.     No vaginal discharge, erythema or tenderness.      Right Adnexa: not tender and no mass present.    Left Adnexa: not tender and no mass  present.    No cervical motion tenderness or polyp.     IUD strings visualized.     Uterus is not enlarged or tender.  Breasts:    Right: No mass, nipple discharge, skin change or tenderness.     Left: No mass, nipple discharge, skin change or tenderness.  Neck:     Thyroid: No thyromegaly.  Cardiovascular:     Rate and Rhythm: Normal rate and regular rhythm.     Heart sounds: Normal heart sounds. No murmur heard. Pulmonary:     Effort: Pulmonary effort is normal.  Breath sounds: Normal breath sounds.  Abdominal:     Palpations: Abdomen is soft.     Tenderness: There is no abdominal tenderness. There is no guarding.  Musculoskeletal:        General: Normal range of motion.     Cervical back: Normal range of motion.  Neurological:     General: No focal deficit present.     Mental Status: She is alert and oriented to person, place, and time.     Cranial Nerves: No cranial nerve deficit.  Skin:    General: Skin is warm and dry.  Psychiatric:        Mood and Affect: Mood normal.        Behavior: Behavior normal.        Thought Content: Thought content normal.        Judgment: Judgment normal.  Vitals reviewed.     Assessment/Plan: Encounter for annual routine gynecological examination  Encounter for routine checking of intrauterine contraceptive device (IUD)  Encounter for screening mammogram for malignant neoplasm of breast - Plan: 3D MAMMOGRAM SCREENING BILATERAL; pt to sched mammo  GYN counsel breast self exam, mammography screening, family planning choices, adequate intake of calcium and vitamin D, diet and exercise     F/U   No follow-ups on file.  Oscar Hank B. Mihailo Sage, PA-C 03/01/2023 8:01 PM

## 2023-03-02 ENCOUNTER — Ambulatory Visit: Payer: BC Managed Care – PPO | Admitting: Obstetrics and Gynecology

## 2023-03-02 DIAGNOSIS — Z1231 Encounter for screening mammogram for malignant neoplasm of breast: Secondary | ICD-10-CM

## 2023-03-02 DIAGNOSIS — Z30431 Encounter for routine checking of intrauterine contraceptive device: Secondary | ICD-10-CM

## 2023-03-02 DIAGNOSIS — Z124 Encounter for screening for malignant neoplasm of cervix: Secondary | ICD-10-CM

## 2023-03-02 DIAGNOSIS — Z1151 Encounter for screening for human papillomavirus (HPV): Secondary | ICD-10-CM

## 2023-03-02 DIAGNOSIS — Z01419 Encounter for gynecological examination (general) (routine) without abnormal findings: Secondary | ICD-10-CM

## 2023-03-06 ENCOUNTER — Ambulatory Visit: Payer: BC Managed Care – PPO | Admitting: Family Medicine

## 2023-03-19 ENCOUNTER — Other Ambulatory Visit: Payer: Self-pay | Admitting: Family Medicine

## 2023-03-19 NOTE — Telephone Encounter (Signed)
Requested Prescriptions  Pending Prescriptions Disp Refills   atenolol (TENORMIN) 50 MG tablet [Pharmacy Med Name: ATENOLOL 50 MG TABLET] 90 tablet 0    Sig: TAKE 1 TABLET BY MOUTH EVERY DAY     Cardiovascular: Beta Blockers 2 Failed - 03/19/2023  2:22 AM      Failed - Last BP in normal range    BP Readings from Last 1 Encounters:  02/09/23 (!) 147/85         Passed - Cr in normal range and within 360 days    Creatinine, Ser  Date Value Ref Range Status  01/02/2023 0.60 0.57 - 1.00 mg/dL Final         Passed - Last Heart Rate in normal range    Pulse Readings from Last 1 Encounters:  02/09/23 76         Passed - Valid encounter within last 6 months    Recent Outpatient Visits           1 month ago Pain of left scapula   [facility] [name], MD   1 month ago Pain of left scapula   [facility] [name], MD   2 months ago Primary hypertension   [place] [facility] [name], MD   3 months ago Primary hypertension   [place] [facility] [name], MD   4 months ago Primary hypertension   [place] [facility] [name], MD       Future Appointments             In 2 months Fisher, Demetrios Isaacs, MD [facility]

## 2023-04-08 ENCOUNTER — Other Ambulatory Visit: Payer: Self-pay | Admitting: Family Medicine

## 2023-04-08 DIAGNOSIS — M1A0420 Idiopathic chronic gout, left hand, without tophus (tophi): Secondary | ICD-10-CM

## 2023-04-08 DIAGNOSIS — F430 Acute stress reaction: Secondary | ICD-10-CM

## 2023-05-12 ENCOUNTER — Encounter: Payer: Self-pay | Admitting: Physician Assistant

## 2023-05-12 ENCOUNTER — Other Ambulatory Visit: Payer: Self-pay | Admitting: Physician Assistant

## 2023-05-12 VITALS — BP 150/94 | HR 80 | Temp 97.3°F | Resp 16 | Ht 62.0 in | Wt 218.0 lb

## 2023-05-12 DIAGNOSIS — R051 Acute cough: Secondary | ICD-10-CM

## 2023-05-12 LAB — POC COVID19 BINAXNOW: SARS Coronavirus 2 Ag: NEGATIVE

## 2023-05-12 MED ORDER — PSEUDOEPH-BROMPHEN-DM 30-2-10 MG/5ML PO SYRP: 5.0000 mL | ORAL_SOLUTION | Freq: Four times a day (QID) | ORAL | 0 refills | Status: DC | PRN

## 2023-05-12 MED ORDER — AMOXICILLIN 875 MG PO TABS: 875.0000 mg | ORAL_TABLET | Freq: Two times a day (BID) | ORAL | 0 refills | Status: AC

## 2023-05-12 NOTE — Progress Notes (Signed)
S/Sx for over a week - started with sneezing Yesterday Cough started - dry Sore throat - feels like on fire.  Able to swallow Bilateral Ear Pain Headache Congestion - unable to cough up anything Unsure if febrile - hasn't checked with thermometer Diarrhea x1 day Fatigue Denies N/V  Exposed to someone this weekend who tested postive for covid.  OTC meds:  Ibuprofen & Flonase  Rapid Covid Test = Negative  AMD

## 2023-05-12 NOTE — Progress Notes (Signed)
   Subjective: Cough and sinus congestion    Patient ID: Kara Murray, female    DOB: 05-04-1976, 47 y.o.   MRN: [id number]  HPI Patient complaining of 1 week of sinus congestion.  Patient stated nonproductive cough started yesterday.  Patient states recent travel out of state and known contact with COVID-19.  This travel occurred over the weekend.  States feels throat secondary to postnasal drainage.  History of allergic rhinitis with daily use of Flonase.   Review of Systems Hyperlipidemia and hypertension    Objective:   Physical Exam    BP 150/94  BP Location Left Arm  Patient Position Sitting  Cuff Size Large  Pulse 80  Resp 16  Temp 97.3 F (36.3 C)  Temp src Temporal  SpO2 97 %  Weight 218 lb (98.9 kg)  Height 5\' 2"  (1.575 m)   BMI 39.87 kg/m2  BSA 2.08 m2  HEENT is remarkable for edematous nasal turbinate postnasal drainage.  Bilaterally guarding with palpation of maxillary sinuses.  Pharynx is erythematous.  Tonsils without exudate.  Neck is supple for lymphadenopathy.  Lungs are clear to auscultation.  Heart is regular rate and rhythm.  COVID-19 test was negative.    Assessment & Plan:  Subacute maxillary sinusitis.  Patient given prescription for amoxicillin and 75 mg twice daily for 10 days.  Patient also given a prescription for Bromfed-DM.  Advised to follow-up if no improvement in 3 days.

## 2023-06-15 ENCOUNTER — Ambulatory Visit: Payer: BC Managed Care – PPO | Admitting: Family Medicine

## 2023-06-15 ENCOUNTER — Encounter: Payer: Self-pay | Admitting: Family Medicine

## 2023-06-15 VITALS — BP 136/85 | HR 85 | Ht 62.0 in | Wt 211.4 lb

## 2023-06-15 DIAGNOSIS — Z23 Encounter for immunization: Secondary | ICD-10-CM | POA: Diagnosis not present

## 2023-06-15 DIAGNOSIS — I10 Essential (primary) hypertension: Secondary | ICD-10-CM | POA: Diagnosis not present

## 2023-06-15 DIAGNOSIS — H6121 Impacted cerumen, right ear: Secondary | ICD-10-CM | POA: Diagnosis not present

## 2023-06-15 DIAGNOSIS — M898X1 Other specified disorders of bone, shoulder: Secondary | ICD-10-CM

## 2023-06-15 MED ORDER — AMOXICILLIN-POT CLAVULANATE 875-125 MG PO TABS: 1.0000 | ORAL_TABLET | Freq: Two times a day (BID) | ORAL | 0 refills | Status: AC

## 2023-06-15 NOTE — Progress Notes (Signed)
Established patient visit   Patient: Kara Murray   DOB: August 19, 1976   47 y.o. Female  MRN: [id number] Visit Date: 06/15/2023  Today's healthcare provider: Mila Merry, MD   Chief Complaint  Patient presents with   Medical Management of Chronic Issues    6 month follow up on left scapula   Ear Pain    Pain radiates from the right ear to the right side of side of eye, has real bad sinuses, has been a problem for a few months    Subjective    HPI  Discussed the use of AI scribe software for clinical note transcription with the patient, who gave verbal consent to proceed.  History of Present Illness   The patient, employed by the city, presented for follow up of hypertension and reporting persistent headaches and ear pressure. She reported a history of sinus infection two weeks prior, treated with amoxicillin. Despite treatment, she continued to experience unilateral headaches, predominantly on the right side, but occasionally switching sides. The headaches were associated with pressure in the ear and nasal stuffiness. The patient also reported a peculiar symptom of eye drainage, akin to crying. The patient reports a history of ear issues, having previously seen an ENT specialist for an inner ear infection a couple of years ago. At that time, the patient had her ears cleaned out due to wax build-up. The patient expressed a desire to revisit the ENT specialist due to the current ear discomfort.     She is also here to follow up pain over scapular. Was initially seen in May for presumed strain of left scapula and treated with methocarbimol and 12 days prednisone taper after which pain had signficantly improved. She states today that no longer having any trouble with her arm or back.    Medications: Outpatient Medications Prior to Visit  Medication Sig   allopurinol (ZYLOPRIM) 100 MG tablet TAKE 1 TABLET BY MOUTH EVERY DAY   amLODipine (NORVASC) 5 MG tablet Take 1 tablet  (5 mg total) by mouth daily.   atenolol (TENORMIN) 50 MG tablet TAKE 1 TABLET BY MOUTH EVERY DAY   brompheniramine-pseudoephedrine-DM 30-2-10 MG/5ML syrup Take 5 mLs by mouth 4 (four) times daily as needed.   fluticasone (FLONASE) 50 MCG/ACT nasal spray Place 2 sprays into both nostrils daily.   levonorgestrel (MIRENA) 20 MCG/24HR IUD 1 Intra Uterine Device (1 each total) by Intrauterine route once for 1 dose.   PARoxetine (PAXIL) 20 MG tablet TAKE 1 TABLET BY MOUTH EVERY DAY   traZODone (DESYREL) 50 MG tablet TAKE 1 TO 2 TABLETS BY MOUTH AT BEDTIME   valsartan-hydrochlorothiazide (DIOVAN-HCT) 80-12.5 MG tablet TAKE 1 TABLET BY MOUTH EVERY DAY   No facility-administered medications prior to visit.    Review of Systems     Objective    BP 136/85 (BP Location: Right Arm, Patient Position: Sitting, Cuff Size: Large)   Pulse 85   Ht 5\' 2"  (1.575 m)   Wt 211 lb 6.4 oz (95.9 kg)   SpO2 100%   BMI 38.67 kg/m    Physical Exam  General Appearance:    Mildly obese female, alert, cooperative, in no acute distress  HENT:   left TM normal without fluid or infection. Right TM obstructed by cerumen, neck without nodes, bilateral sinus tender, and nasal mucosa pale and congested  Eyes:    PERRL, conjunctiva/corneas clear, EOM's intact       Lungs:     Clear to auscultation  bilaterally, respirations unlabored  Heart:    Normal heart rate. Normal rhythm. No murmurs, rubs, or gallops.    Neurologic:   Awake, alert, oriented x 3. No apparent focal neurological           defect.         Assessment & Plan        Sinusitis possible otitis Persistent right-sided headache and pressure in the ear, with a history of recent sinus infection treated with amoxicillin. Nasal drainage and tearing also reported. Already has fluticasone nasal spray. Rx Augmentin sent to her pharmacy.   Right cerumen impaction - CMA instilled Debrox and attempted irrigation, but cerumen remains very tightly packed and dry  out. Recommend she use OTC Debrox for a few nights at home, may need to return to ENT for manual cerumen removal.   Musculoskeletal Back Pain Resolved  Hypertension - Well controlled, continue current medication            Mila Merry, MD  [facility] [phone] (phone) [phone] (fax)  [facility]

## 2023-06-15 NOTE — Patient Instructions (Signed)
Please review the attached list of medications and notify my office if there are any errors.   Use over the counter Debrox ear wax removal drops at night to clear out the wax plugging up your right year

## 2023-06-26 ENCOUNTER — Other Ambulatory Visit: Payer: Self-pay | Admitting: Family Medicine

## 2023-06-26 NOTE — Telephone Encounter (Signed)
Requested Prescriptions  Pending Prescriptions Disp Refills   atenolol (TENORMIN) 50 MG tablet [Pharmacy Med Name: ATENOLOL 50 MG TABLET] 90 tablet 0    Sig: TAKE 1 TABLET BY MOUTH EVERY DAY     Cardiovascular: Beta Blockers 2 Passed - 06/26/2023  2:41 AM      Passed - Cr in normal range and within 360 days    Creatinine, Ser  Date Value Ref Range Status  01/02/2023 0.60 0.57 - 1.00 mg/dL Final         Passed - Last BP in normal range    BP Readings from Last 1 Encounters:  06/15/23 136/85         Passed - Last Heart Rate in normal range    Pulse Readings from Last 1 Encounters:  06/15/23 85         Passed - Valid encounter within last 6 months    Recent Outpatient Visits           1 week ago Primary hypertension   [facility] [name], MD   4 months ago Pain of left scapula   [facility] [name], MD   5 months ago Pain of left scapula   [facility] [name], MD   6 months ago Primary hypertension   [place] [facility] [name], MD   6 months ago Primary hypertension   [facility] [name], MD

## 2023-07-12 ENCOUNTER — Other Ambulatory Visit: Payer: Self-pay | Admitting: Family Medicine

## 2023-07-12 DIAGNOSIS — M1A0420 Idiopathic chronic gout, left hand, without tophus (tophi): Secondary | ICD-10-CM

## 2023-07-12 DIAGNOSIS — F430 Acute stress reaction: Secondary | ICD-10-CM

## 2023-07-25 ENCOUNTER — Other Ambulatory Visit: Payer: Self-pay | Admitting: Family Medicine

## 2023-07-26 ENCOUNTER — Other Ambulatory Visit: Payer: Self-pay | Admitting: Family Medicine

## 2023-07-26 DIAGNOSIS — I10 Essential (primary) hypertension: Secondary | ICD-10-CM

## 2023-08-29 ENCOUNTER — Other Ambulatory Visit: Payer: Self-pay | Admitting: Family Medicine

## 2023-08-29 DIAGNOSIS — I10 Essential (primary) hypertension: Secondary | ICD-10-CM

## 2023-08-31 NOTE — Telephone Encounter (Signed)
Requested medication (s) are due for refill today: yes  Requested medication (s) are on the active medication list: yes  Last refill:  12/28/22 #90 1 refills  Future visit scheduled: no  Notes to clinic:  protocol failed. Last labs 01/02/23. Do you want to refill Rx?     Requested Prescriptions  Pending Prescriptions Disp Refills   valsartan-hydrochlorothiazide (DIOVAN-HCT) 80-12.5 MG tablet [Pharmacy Med Name: VALSARTAN-HCTZ 80-12.5 MG TAB] 90 tablet 1    Sig: TAKE 1 TABLET BY MOUTH EVERY DAY     Cardiovascular: ARB + Diuretic Combos Failed - 08/31/2023  2:39 PM      Failed - K in normal range and within 180 days    Potassium  Date Value Ref Range Status  01/02/2023 4.8 3.5 - 5.2 mmol/L Final         Failed - Na in normal range and within 180 days    Sodium  Date Value Ref Range Status  01/02/2023 136 134 - 144 mmol/L Final         Failed - Cr in normal range and within 180 days    Creatinine, Ser  Date Value Ref Range Status  01/02/2023 0.60 0.57 - 1.00 mg/dL Final         Failed - eGFR is 10 or above and within 180 days    GFR calc Af Amer  Date Value Ref Range Status  01/06/2020 122 >59 mL/min/1.73 Final    Comment:    **[facility] currently reports eGFR in compliance with the current**   recommendations of the [employer]. [facility] will   update reporting as new guidelines are published from the NKF-ASN   Task force.    GFR, Estimated  Date Value Ref Range Status  08/15/2022 >60 >60 mL/min Final    Comment:    (NOTE) Calculated using the CKD-EPI Creatinine Equation (2021)    eGFR  Date Value Ref Range Status  01/02/2023 111 >59 mL/min/1.73 Final         Passed - Patient is not pregnant      Passed - Last BP in normal range    BP Readings from Last 1 Encounters:  06/15/23 136/85         Passed - Valid encounter within last 6 months    Recent Outpatient Visits           2 months ago Primary hypertension   [facility] [name], MD   6 months ago Pain of left scapula   [facility] [name], MD   7 months ago Pain of left scapula   [facility] [name], MD   8 months ago Primary hypertension   [facility] [name], MD   8 months ago Primary hypertension   [facility] [name], MD

## 2023-09-13 ENCOUNTER — Encounter: Payer: Self-pay | Admitting: Emergency Medicine

## 2023-09-13 ENCOUNTER — Ambulatory Visit (INDEPENDENT_AMBULATORY_CARE_PROVIDER_SITE_OTHER): Payer: 59

## 2023-09-13 ENCOUNTER — Ambulatory Visit
Admission: EM | Admit: 2023-09-13 | Discharge: 2023-09-13 | Disposition: A | Discharge status: Home / Self Care | Payer: 59 | Attending: Physician Assistant | Admitting: Physician Assistant

## 2023-09-13 DIAGNOSIS — R0602 Shortness of breath: Secondary | ICD-10-CM | POA: Diagnosis present

## 2023-09-13 DIAGNOSIS — R051 Acute cough: Secondary | ICD-10-CM

## 2023-09-13 DIAGNOSIS — B349 Viral infection, unspecified: Secondary | ICD-10-CM

## 2023-09-13 LAB — RESP PANEL BY RT-PCR (FLU A&B, COVID) ARPGX2
Influenza A by PCR: NEGATIVE
Influenza B by PCR: NEGATIVE
SARS Coronavirus 2 by RT PCR: NEGATIVE

## 2023-09-13 MED ORDER — PROMETHAZINE-DM 6.25-15 MG/5ML PO SYRP
5.0000 mL | ORAL_SOLUTION | Freq: Four times a day (QID) | ORAL | 0 refills | Status: DC | PRN
Start: 2023-09-13 — End: 2024-02-08

## 2023-09-13 NOTE — ED Provider Notes (Signed)
 [facility]    CSN: [id number] Arrival date & time: 09/13/23  1152      History   Chief Complaint Chief Complaint  Patient presents with   Cough   Sinus Problem    HPI Nyelle Wolfson is a 48 y.o. female presenting for approximately 2-day history of fatigue, sinus pressure, headaches, nasal congestion, cough, slight shortness of breath, diarrhea.  Denies fever, sore throat, pain in chest, chest or vomiting.  Reports multiple sick family members.  Recent travel from [place] .  Has taken OTC medication without relief.  Medical history significant for hypertension, gout, and sickle cell trait.  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   Electrolyte disturbance 07/09/2022   Insomnia 07/09/2022   Alcohol abuse 07/09/2022   Hyponatremia 07/09/2022   Hypokalemia 07/09/2022   Chronic diarrhea 07/09/2022   Abnormal LFTs 07/09/2022   Sickle cell trait (HCC) 01/20/2022   Elevated ferritin 01/20/2022   Hypertriglyceridemia 01/08/2022   History of anemia 01/08/2022   Family history of diabetes mellitus (DM) 01/08/2022   History of gout 01/08/2022   Lateral epicondylitis, left elbow 04/13/2020   Pain in right knee 01/26/2018   Osteoarthritis of knee 11/13/2017   Localized, primary osteoarthritis 11/13/2017   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   Hypertension 05/29/2015    Past Surgical History:  Procedure Laterality Date   biariactric surgery  09/08/2008   KNEE SURGERY Right 1994, 2001   THUMB FUSION Left 04/11/2014   TOE SURGERY  09/08/2006   TOTAL KNEE ARTHROPLASTY Right 08/14/2022   Procedure: TOTAL KNEE ARTHROPLASTY;  Surgeon: Lorelle Hussar, MD;  Location: [facility]  ORS;  Service: Orthopedics;  Laterality: Right;    OB History     Gravida  4   Para  2   Term  2   Preterm  0   AB  2   Living  2      SAB  0   IAB  2   Ectopic  0   Multiple  0   Live Births  2            Home Medications    Prior to Admission medications   Medication Sig Start Date End Date Taking? Authorizing Provider  allopurinol  (ZYLOPRIM ) 100 MG tablet TAKE 1 TABLET BY MOUTH EVERY DAY 07/13/23  Yes Gasper Nancyann BRAVO, MD  amLODipine  (NORVASC ) 5 MG tablet TAKE 1 TABLET (5 MG TOTAL) BY MOUTH DAILY. 07/27/23  Yes Gasper Nancyann BRAVO, MD  atenolol  (TENORMIN ) 50 MG tablet TAKE 1 TABLET BY MOUTH EVERY DAY 06/26/23  Yes Gasper Nancyann BRAVO, MD  levonorgestrel  (MIRENA ) 20 MCG/24HR IUD 1 Intra Uterine Device (1 each total) by Intrauterine route once for 1 dose. 01/06/18 09/13/23 Yes Copland, Alicia B, PA-C  PARoxetine  (PAXIL ) 20 MG tablet TAKE 1 TABLET BY MOUTH EVERY DAY 07/13/23  Yes Gasper Nancyann BRAVO, MD  promethazine -dextromethorphan (PROMETHAZINE -DM) 6.25-15 MG/5ML syrup Take 5 mLs by mouth 4 (four) times daily as needed. 09/13/23  Yes Arvis Huxley B, PA-C  valsartan -hydrochlorothiazide  (DIOVAN -HCT) 80-12.5 MG tablet TAKE 1 TABLET BY MOUTH EVERY DAY 08/31/23  Yes  Gasper Nancyann BRAVO, MD  fluticasone  (FLONASE ) 50 MCG/ACT nasal spray Place 2 sprays into both nostrils daily. 10/29/22   Gasper Nancyann BRAVO, MD  traZODone  (DESYREL ) 50 MG tablet TAKE 1 TO 2 TABLETS BY MOUTH AT BEDTIME 07/27/23   Gasper Nancyann BRAVO, MD    Family History Family History  Problem Relation Age of Onset   Hypertension Mother    Diabetes Mother    Heart attack Father    Hypertension Brother    Breast cancer Maternal Aunt 73   Breast cancer Cousin     Social History Social History   Tobacco Use   Smoking status: Former    Current packs/day: 0.00    Types: Cigarettes    Quit date: 2019    Years since quitting: 6.0   Smokeless tobacco: Never   Tobacco comments:    socially  Vaping Use   Vaping  status: Never Used  Substance Use Topics   Alcohol use: Yes    Alcohol/week: 2.0 standard drinks of alcohol    Types: 2 Cans of beer per week    Comment: socially    Drug use: Yes    Types: Marijuana     Allergies   Hydrocodone  and Oxycodone    Review of Systems Review of Systems  Constitutional:  Positive for fatigue. Negative for chills, diaphoresis and fever.  HENT:  Positive for congestion, rhinorrhea and sinus pressure. Negative for ear pain, sinus pain and sore throat.   Respiratory:  Positive for cough and shortness of breath. Negative for wheezing.   Cardiovascular:  Negative for chest pain.  Gastrointestinal:  Positive for diarrhea. Negative for abdominal pain, nausea and vomiting.  Musculoskeletal:  Negative for arthralgias and myalgias.  Skin:  Negative for rash.  Neurological:  Positive for headaches. Negative for weakness.  Hematological:  Negative for adenopathy.     Physical Exam Triage Vital Signs ED Triage Vitals  Encounter Vitals Group     BP 09/13/23 1411 136/88     Systolic BP Percentile --      Diastolic BP Percentile --      Pulse Rate 09/13/23 1411 74     Resp 09/13/23 1411 14     Temp 09/13/23 1411 98.4 F (36.9 C)     Temp Source 09/13/23 1411 Oral     SpO2 09/13/23 1411 95 %     Weight 09/13/23 1409 211 lb 6.7 oz (95.9 kg)     Height 09/13/23 1409 5' 2 (1.575 m)     Head Circumference --      Peak Flow --      Pain Score 09/13/23 1409 8     Pain Loc --      Pain Education --      Exclude from Growth Chart --    No data found.  Updated Vital Signs BP 136/88 (BP Location: Right Arm)   Pulse 74   Temp 98.4 F (36.9 C) (Oral)   Resp 14   Ht 5' 2 (1.575 m)   Wt 211 lb 6.7 oz (95.9 kg)   SpO2 95%   BMI 38.67 kg/m     Physical Exam Vitals and nursing note reviewed.  Constitutional:      General: She is not in acute distress.    Appearance: Normal appearance. She is ill-appearing. She is not toxic-appearing.  HENT:     Head:  Normocephalic and atraumatic.     Right Ear: Tympanic membrane, ear canal and external ear normal.  Left Ear: Tympanic membrane, ear canal and external ear normal.     Nose: Congestion present.     Mouth/Throat:     Mouth: Mucous membranes are moist.     Pharynx: Oropharynx is clear.  Eyes:     General: No scleral icterus.       Right eye: No discharge.        Left eye: No discharge.     Conjunctiva/sclera: Conjunctivae normal.  Cardiovascular:     Rate and Rhythm: Normal rate and regular rhythm.     Heart sounds: Normal heart sounds.  Pulmonary:     Effort: Pulmonary effort is normal. No respiratory distress.     Breath sounds: Rhonchi (bilateral upper lung fields) present.  Musculoskeletal:     Cervical back: Neck supple.  Skin:    General: Skin is dry.  Neurological:     General: No focal deficit present.     Mental Status: She is alert. Mental status is at baseline.     Motor: No weakness.     Gait: Gait normal.  Psychiatric:        Mood and Affect: Mood normal.        Behavior: Behavior normal.      UC Treatments / Results  Labs (all labs ordered are listed, but only abnormal results are displayed) Labs Reviewed  RESP PANEL BY RT-PCR (FLU A&B, COVID) [id number]    EKG   Radiology No results found.  Procedures Procedures (including critical care time)  Medications Ordered in UC Medications - No data to display  Initial Impression / Assessment and Plan / UC Course  I have reviewed the triage vital signs and the nursing notes.  Pertinent labs & imaging results that were available during my care of the patient were reviewed by me and considered in my medical decision making (see chart for details).   48 year old female presents for 2-day history of fatigue, cough, congestion, runny nose, shortness of breath and diarrhea.  Multiple sick family members.  No known fever.  Vitals are stable and normal.  She is ill-appearing but nontoxic.  On exam is nasal  congestion.  Throat clear.  Few scattered rhonchi bilateral upper lung fields, more so on the right.  Will obtain respiratory panel and chest x-ray.  Wet read chest x-ray negative.  Likely viral illness.  Supportive care encouraged with increasing rest and fluids.  Sent Promethazine  DM to pharmacy.  Will change treatment plan based on results obtained today.  Work note given.  Negative CXR overread and negative resp panel.  Results communicated to patient through MyChart.  No change to treatment plan.   Final Clinical Impressions(s) / UC Diagnoses   Final diagnoses:  Acute cough  Viral illness  Shortness of breath     Discharge Instructions      -Testing for COVID, flu and also checking for pneumonia.  I will call with any abnormal results.  I will send you message in MyChart with your official results before we close tonight. - I sent cough medicine for you.  Increase rest and fluids. - If all of your testing is negative, you likely have another virus and symptoms can take a week or 2 to resolve. - You should seek reevaluation if you develop a fever, breathing problem or are not feeling better in a couple weeks.     ED Prescriptions     Medication Sig Dispense Auth. Provider   promethazine -dextromethorphan (PROMETHAZINE -DM) 6.25-15 MG/5ML syrup Take 5 mLs by  mouth 4 (four) times daily as needed. 118 mL Arvis Jolan NOVAK, PA-C      PDMP not reviewed this encounter.   Arvis Jolan NOVAK, PA-C 09/13/23 1521

## 2023-09-13 NOTE — ED Triage Notes (Signed)
 Patient c/o cough, nasal congestion, sinus pressure, headache, and diarrhea that started 2 days ago.

## 2023-09-13 NOTE — Discharge Instructions (Signed)
-  Testing for COVID, flu and also checking for pneumonia.  I will call with any abnormal results.  I will send you message in MyChart with your official results before we close tonight. - I sent cough medicine for you.  Increase rest and fluids. - If all of your testing is negative, you likely have another virus and symptoms can take a week or 2 to resolve. - You should seek reevaluation if you develop a fever, breathing problem or are not feeling better in a couple weeks.

## 2023-09-24 ENCOUNTER — Other Ambulatory Visit: Payer: Self-pay | Admitting: Family Medicine

## 2023-09-24 DIAGNOSIS — I10 Essential (primary) hypertension: Secondary | ICD-10-CM

## 2023-09-24 NOTE — Telephone Encounter (Signed)
Medication Refill -  Most Recent Primary Care Visit:  Provider: [name]  Department: [facility]  Visit Type: OFFICE VISIT  Date: 06/15/2023  Medication: amLODipine (NORVASC) 5 MG tablet atenolol (TENORMIN) 50 MG tablet  Has the patient contacted their pharmacy? No Pt out of refills, contacted office.   Is this the correct pharmacy for this prescription? Yes This is the patient's preferred pharmacy:   [facility] [phone] Dan [place]re[place], [place] - [address] [place] [address] Phone: [phone] Fax: [phone]   Has the prescription been filled recently? No  Is the patient out of the medication? Yes  Has the patient been seen for an appointment in the last year OR does the patient have an upcoming appointment? Yes  Can we respond through MyChart? Yes  Agent: Please be advised that Rx refills may take up to 3 business days. We ask that you follow-up with your pharmacy.

## 2023-09-25 MED ORDER — AMLODIPINE BESYLATE 5 MG PO TABS: 5.0000 mg | ORAL_TABLET | Freq: Every day | ORAL | 0 refills | Status: DC

## 2023-09-25 MED ORDER — ATENOLOL 50 MG PO TABS: 50.0000 mg | ORAL_TABLET | Freq: Every day | ORAL | 0 refills | Status: DC

## 2023-09-25 NOTE — Telephone Encounter (Signed)
Requested Prescriptions  Pending Prescriptions Disp Refills   amLODipine (NORVASC) 5 MG tablet 90 tablet 0    Sig: Take 1 tablet (5 mg total) by mouth daily.     Cardiovascular: Calcium Channel Blockers 2 Passed - 09/25/2023  9:16 AM      Passed - Last BP in normal range    BP Readings from Last 1 Encounters:  09/13/23 136/88         Passed - Last Heart Rate in normal range    Pulse Readings from Last 1 Encounters:  09/13/23 74         Passed - Valid encounter within last 6 months    Recent Outpatient Visits           3 months ago Primary hypertension   [facility] [name], MD   7 months ago Pain of left scapula   [facility] [name], MD   8 months ago Pain of left scapula   [facility] [name], MD   9 months ago Primary hypertension   [facility] [name], MD   9 months ago Primary hypertension   [facility] [name], MD               atenolol (TENORMIN) 50 MG tablet 90 tablet 0    Sig: Take 1 tablet (50 mg total) by mouth daily.     Cardiovascular: Beta Blockers 2 Passed - 09/25/2023  9:16 AM      Passed - Cr in normal range and within 360 days    Creatinine, Ser  Date Value Ref Range Status  01/02/2023 0.60 0.57 - 1.00 mg/dL Final         Passed - Last BP in normal range    BP Readings from Last 1 Encounters:  09/13/23 136/88         Passed - Last Heart Rate in normal range    Pulse Readings from Last 1 Encounters:  09/13/23 74         Passed - Valid encounter within last 6 months    Recent Outpatient Visits           3 months ago Primary hypertension    [facility] [name], MD   7 months ago Pain of left scapula   [facility] [name], MD   8 months ago Pain of left scapula   [facility] [name], MD   9 months ago Primary hypertension    [facility] [name], MD   9 months ago Primary hypertension   [facility] [name], MD

## 2023-10-15 ENCOUNTER — Other Ambulatory Visit: Payer: Self-pay | Admitting: Obstetrics and Gynecology

## 2023-10-15 DIAGNOSIS — Z1231 Encounter for screening mammogram for malignant neoplasm of breast: Secondary | ICD-10-CM

## 2023-10-21 ENCOUNTER — Ambulatory Visit
Admission: RE | Admit: 2023-10-21 | Discharge: 2023-10-21 | Disposition: A | Discharge status: Home / Self Care | Payer: 59 | Source: Ambulatory Visit | Attending: Obstetrics and Gynecology | Admitting: Obstetrics and Gynecology

## 2023-10-21 DIAGNOSIS — Z1231 Encounter for screening mammogram for malignant neoplasm of breast: Secondary | ICD-10-CM | POA: Insufficient documentation

## 2023-10-27 ENCOUNTER — Other Ambulatory Visit: Payer: Self-pay | Admitting: *Deleted

## 2023-10-27 ENCOUNTER — Inpatient Hospital Stay
Admission: RE | Admit: 2023-10-27 | Discharge: 2023-10-27 | Disposition: A | Discharge status: Home / Self Care | Payer: Self-pay | Source: Ambulatory Visit | Attending: Family Medicine | Admitting: Family Medicine

## 2023-10-27 ENCOUNTER — Encounter: Payer: Self-pay | Admitting: Obstetrics and Gynecology

## 2023-10-27 DIAGNOSIS — Z1231 Encounter for screening mammogram for malignant neoplasm of breast: Secondary | ICD-10-CM

## 2023-11-22 ENCOUNTER — Other Ambulatory Visit: Payer: Self-pay | Admitting: Family Medicine

## 2023-11-22 DIAGNOSIS — M1A0420 Idiopathic chronic gout, left hand, without tophus (tophi): Secondary | ICD-10-CM

## 2023-11-22 DIAGNOSIS — F430 Acute stress reaction: Secondary | ICD-10-CM

## 2023-12-27 ENCOUNTER — Other Ambulatory Visit: Payer: Self-pay | Admitting: Family Medicine

## 2023-12-27 DIAGNOSIS — I10 Essential (primary) hypertension: Secondary | ICD-10-CM

## 2023-12-31 ENCOUNTER — Other Ambulatory Visit: Payer: Self-pay | Admitting: Family Medicine

## 2023-12-31 DIAGNOSIS — F430 Acute stress reaction: Secondary | ICD-10-CM

## 2023-12-31 NOTE — Telephone Encounter (Signed)
 Copied from CRM [id number]. Topic: Clinical - Medication Refill >> Dec 31, 2023 11:09 AM Antwanette L wrote: Most Recent Primary Care Visit:  Provider: Lamon Pillow  Department: [facility]  Visit Type: OFFICE VISIT  Date: 06/15/2023  Medication: PARoxetine  (PAXIL ) 20 MG tablet  Has the patient contacted their pharmacy? No   Is this the correct pharmacy for this prescription? Yes  [facility] [phone] [place], [place] - [address] [place] [address] Phone: [phone] Fax: [phone]   Has the prescription been filled recently? No. Last refilled on 11/22/23  Is the patient out of the medication? Yes  Has the patient been seen for an appointment in the last year OR does the patient have an upcoming appointment? Yes  Can we respond through MyChart? No. Contact patient by phone at [phone]  Agent: Please be advised that Rx refills may take up to 3 business days. We ask that you follow-up with your pharmacy.

## 2024-01-01 NOTE — Telephone Encounter (Signed)
 Too soon for refill, last refill 11/22/23 for 90 days. OV needed for additional refills.  Requested Prescriptions  Pending Prescriptions Disp Refills   PARoxetine  (PAXIL ) 20 MG tablet 90 tablet 0    Sig: Take 1 tablet (20 mg total) by mouth daily.     Psychiatry:  Antidepressants - SSRI Failed - 01/01/2024 10:50 AM      Failed - Valid encounter within last 6 months    Recent Outpatient Visits   None

## 2024-01-13 ENCOUNTER — Other Ambulatory Visit: Payer: Self-pay

## 2024-01-13 ENCOUNTER — Other Ambulatory Visit: Payer: Self-pay | Admitting: Family Medicine

## 2024-01-13 DIAGNOSIS — F430 Acute stress reaction: Secondary | ICD-10-CM

## 2024-01-13 MED ORDER — PAROXETINE HCL 20 MG PO TABS: 20.0000 mg | ORAL_TABLET | Freq: Every day | ORAL | 0 refills | Status: DC

## 2024-01-13 MED ORDER — TRAZODONE HCL 50 MG PO TABS: 50.0000 mg | ORAL_TABLET | Freq: Every day | ORAL | 0 refills | Status: DC

## 2024-01-13 NOTE — Telephone Encounter (Signed)
 Copied from CRM [id number]. Topic: Clinical - Medication Refill >> Jan 13, 2024 12:46 PM DeAngela L wrote: Medication:  traZODone  (DESYREL ) 50 MG tablet   Has the patient contacted their pharmacy? No  (Agent: If no, request that the patient contact the pharmacy for the refill. If patient does not wish to contact the pharmacy document the reason why and proceed with request.) (Agent: If yes, when and what did the pharmacy advise?)  This is the patient's preferred pharmacy:  [facility] [phone] [place], [place] - [address] [place] [address] Phone: [phone] Fax: [phone]   Is this the correct pharmacy for this prescription? Yes If no, delete pharmacy and type the correct one.   Has the prescription been filled recently? Yes  Is the patient out of the medication? Yes  Has the patient been seen for an appointment in the last year OR does the patient have an upcoming appointment? Yes  Can we respond through MyChart? Yes  Agent: Please be advised that Rx refills may take up to 3 business days. We ask that you follow-up with your pharmacy.

## 2024-01-15 NOTE — Telephone Encounter (Signed)
 Requested Prescriptions  Refused Prescriptions Disp Refills   traZODone  (DESYREL ) 50 MG tablet 60 tablet 0    Sig: Take 1-2 tablets (50-100 mg total) by mouth at bedtime.     Psychiatry: Antidepressants - Serotonin Modulator Failed - 01/15/2024 12:07 PM      Failed - Valid encounter within last 6 months    Recent Outpatient Visits   None

## 2024-02-08 ENCOUNTER — Encounter: Payer: Self-pay | Admitting: Family Medicine

## 2024-02-08 ENCOUNTER — Ambulatory Visit: Admitting: Family Medicine

## 2024-02-08 VITALS — BP 131/89 | HR 76 | Resp 16 | Ht 62.0 in | Wt 205.0 lb

## 2024-02-08 DIAGNOSIS — R739 Hyperglycemia, unspecified: Secondary | ICD-10-CM | POA: Diagnosis not present

## 2024-02-08 DIAGNOSIS — E781 Pure hyperglyceridemia: Secondary | ICD-10-CM

## 2024-02-08 DIAGNOSIS — Z1211 Encounter for screening for malignant neoplasm of colon: Secondary | ICD-10-CM | POA: Diagnosis not present

## 2024-02-08 DIAGNOSIS — I10 Essential (primary) hypertension: Secondary | ICD-10-CM | POA: Diagnosis not present

## 2024-02-08 NOTE — Progress Notes (Signed)
 Established patient visit   Patient: Kara Murray   DOB: 21-Oct-1975   48 y.o. Female  MRN: [id number] Visit Date: 02/08/2024  Today's healthcare provider: Jeralene Mom, MD   Chief Complaint  Patient presents with   Medical Management of Chronic Issues    HTN follow-up   Subjective    Discussed the use of AI scribe software for clinical note transcription with the patient, who gave verbal consent to proceed.  History of Present Illness   Kara Murray is a 48 year old female who presents for a check-up on her blood pressure medications.  She does not monitor her blood pressure at home but occasionally checks it at school, where it usually appears normal. No chest pain, heart flutter, shortness of breath, or swelling in her feet or legs.  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 This causes pain in her hip and back, making it difficult to stand and cook. She stands on her tiptoe to compensate for the difference, estimating a height discrepancy of about four centimeters.  She uses an ankle brace for pain on the side of her foot, but it is not effectively alleviating her symptoms. The brace is difficult to fit into a tennis shoe and causes additional pain on the side of her foot due to leaning. She wants to stand straight without leaning to relieve her back pain and resume cooking.  She recently visited a podiatrist who provided the ankle brace.     Lab Results  Component Value Date   NA 136 01/02/2023   CL 102 01/02/2023   K 4.8 01/02/2023   CO2 19 (L) 01/02/2023   BUN 5 (L) 01/02/2023   CREATININE 0.60 01/02/2023   EGFR 111 01/02/2023   CALCIUM  8.6 (L) 01/02/2023   PHOS 4.0 01/02/2023   ALBUMIN 3.9 01/02/2023   GLUCOSE 84 01/02/2023   Lab Results  Component Value Date   HGBA1C 5.5 01/02/2023     Medications: Outpatient Medications Prior to Visit   Medication Sig   allopurinol  (ZYLOPRIM ) 100 MG tablet TAKE 1 TABLET BY MOUTH EVERY DAY   amLODipine  (NORVASC ) 5 MG tablet TAKE 1 TABLET BY MOUTH EVERY DAY   atenolol  (TENORMIN ) 50 MG tablet TAKE 1 TABLET BY MOUTH EVERY DAY   fluticasone  (FLONASE ) 50 MCG/ACT nasal spray Place 2 sprays into both nostrils daily.   hydrOXYzine (ATARAX) 25 MG tablet Take 25 mg by mouth daily.   PARoxetine  (PAXIL ) 20 MG tablet Take 1 tablet (20 mg total) by mouth daily.   traZODone  (DESYREL ) 50 MG tablet Take 1-2 tablets (50-100 mg total) by mouth at bedtime.   triamcinolone ointment (KENALOG) 0.1 % Apply 1 Application topically 2 (two) times daily.   valsartan -hydrochlorothiazide  (DIOVAN -HCT) 80-12.5 MG tablet TAKE 1 TABLET BY MOUTH EVERY DAY   levonorgestrel  (MIRENA ) 20 MCG/24HR IUD 1 Intra Uterine Device (1 each total) by Intrauterine route once for 1 dose.   No facility-administered medications prior to visit.   Review of Systems  Constitutional:  Negative for appetite change, chills, fatigue and fever.  Respiratory:  Negative for chest tightness and shortness of breath.   Cardiovascular:  Negative for chest pain and palpitations.  Gastrointestinal:  Negative for abdominal pain, nausea and vomiting.  Neurological:  Negative for dizziness and weakness.       Objective    BP 131/89 (BP Location: Left Arm, Patient Position: Sitting,  Cuff Size: Normal)   Pulse 76   Resp 16   Ht 5\' 2"  (1.575 m)   Wt 205 lb (93 kg)   SpO2 100%   BMI 37.49 kg/m   Physical Exam   General: Appearance:    Mildly obese female in no acute distress  Eyes:    PERRL, conjunctiva/corneas clear, EOM's intact       Lungs:     Clear to auscultation bilaterally, respirations unlabored  Heart:    Normal heart rate. Normal rhythm. No murmurs, rubs, or gallops.    MS:   All extremities are intact.  Left leg 1-2 inches shorter than right.   Neurologic:   Awake, alert, oriented x 3. No apparent focal neurological defect.           Assessment & Plan        Pain due to leg length discrepancy post knee replacement Chronic pain from leg length discrepancy post knee replacement causing significant discomfort and functional limitations. Current ankle brace insufficient. - Recommend visiting [employer] store for shoe inserts. - Consider referral to podiatry for orthotic evaluation if needed.  Hypertension Hypertension well-controlled. No symptoms reported. Blood pressure occasionally checked at school, within normal limits. - Order yearly blood work including cholesterol, kidney function, and glucose levels.        Referred GI for colon cancer screening.   Patient to contact her gyn for routine pap/pelvic  Jeralene Mom, MD  [facility] [phone] (phone) [phone] (fax)  [facility]

## 2024-02-08 NOTE — Patient Instructions (Signed)
 Please review the attached list of medications and notify my office if there are any errors.   Call [facility] at [phone] to schedule your colonoscopy if you don't here from them within a week

## 2024-02-09 ENCOUNTER — Ambulatory Visit: Payer: Self-pay | Admitting: Family Medicine

## 2024-02-09 DIAGNOSIS — Z833 Family history of diabetes mellitus: Secondary | ICD-10-CM

## 2024-02-09 LAB — COMPREHENSIVE METABOLIC PANEL WITH GFR
ALT: 16 IU/L (ref 0–32)
AST: 56 IU/L — ABNORMAL HIGH (ref 0–40)
Albumin: 4 g/dL (ref 3.9–4.9)
Alkaline Phosphatase: 69 IU/L (ref 44–121)
BUN/Creatinine Ratio: 10 (ref 9–23)
BUN: 7 mg/dL (ref 6–24)
Bilirubin Total: 0.3 mg/dL (ref 0.0–1.2)
CO2: 17 mmol/L — ABNORMAL LOW (ref 20–29)
Calcium: 8.7 mg/dL (ref 8.7–10.2)
Chloride: 103 mmol/L (ref 96–106)
Creatinine, Ser: 0.7 mg/dL (ref 0.57–1.00)
Globulin, Total: 2.5 g/dL (ref 1.5–4.5)
Glucose: 93 mg/dL (ref 70–99)
Potassium: 4.7 mmol/L (ref 3.5–5.2)
Sodium: 136 mmol/L (ref 134–144)
Total Protein: 6.5 g/dL (ref 6.0–8.5)
eGFR: 106 mL/min/{1.73_m2} (ref >59)

## 2024-02-09 LAB — CBC
Hematocrit: 30.9 % — ABNORMAL LOW (ref 34.0–46.6)
Hemoglobin: 10.1 g/dL — ABNORMAL LOW (ref 11.1–15.9)
MCH: 29.7 pg (ref 26.6–33.0)
MCHC: 32.7 g/dL (ref 31.5–35.7)
MCV: 91 fL (ref 79–97)
Platelets: 308 10*3/uL (ref 150–450)
RBC: 3.4 x10E6/uL — ABNORMAL LOW (ref 3.77–5.28)
RDW: 22.2 % — ABNORMAL HIGH (ref 11.7–15.4)
WBC: 4.3 10*3/uL (ref 3.4–10.8)

## 2024-02-09 LAB — LIPID PANEL
Chol/HDL Ratio: 1.5 ratio (ref 0.0–4.4)
Cholesterol, Total: 225 mg/dL — ABNORMAL HIGH (ref 100–199)
HDL: 148 mg/dL (ref >39)
LDL Chol Calc (NIH): 40 mg/dL (ref 0–99)
Triglycerides: 260 mg/dL — ABNORMAL HIGH (ref 0–149)
VLDL Cholesterol Cal: 37 mg/dL (ref 5–40)

## 2024-02-09 LAB — HEMOGLOBIN A1C
Est. average glucose Bld gHb Est-mCnc: 91 mg/dL
Hgb A1c MFr Bld: 4.8 % (ref 4.8–5.6)

## 2024-02-12 ENCOUNTER — Other Ambulatory Visit: Payer: Self-pay | Admitting: Family Medicine

## 2024-02-12 DIAGNOSIS — F430 Acute stress reaction: Secondary | ICD-10-CM

## 2024-02-18 ENCOUNTER — Telehealth: Payer: Self-pay

## 2024-02-18 ENCOUNTER — Other Ambulatory Visit: Payer: Self-pay

## 2024-02-18 DIAGNOSIS — Z1211 Encounter for screening for malignant neoplasm of colon: Secondary | ICD-10-CM

## 2024-02-18 MED ORDER — NA SULFATE-K SULFATE-MG SULF 17.5-3.13-1.6 GM/177ML PO SOLN: 1.0000 | Freq: Once | ORAL | 0 refills | Status: AC

## 2024-02-18 NOTE — Telephone Encounter (Signed)
 Gastroenterology Pre-Procedure Review  Request Date: 03/30/24 Requesting Physician: Dr. Cornel Diesel  PATIENT REVIEW QUESTIONS: The patient responded to the following health history questions as indicated:    1. Are you having any GI issues? no 2. Do you have a personal history of Polyps? no 3. Do you have a family history of Colon Cancer or Polyps? no 4. Diabetes Mellitus? no 5. Joint replacements in the past 12 months?no 6. Major health problems in the past 3 months?no 7. Any artificial heart valves, MVP, or defibrillator?no    MEDICATIONS & ALLERGIES:    Patient reports the following regarding taking any anticoagulation/antiplatelet therapy:   Plavix, Coumadin, Eliquis, Xarelto, Lovenox , Pradaxa, Brilinta, or Effient? no Aspirin? no  Patient confirms/reports the following medications:  Current Outpatient Medications  Medication Sig Dispense Refill   allopurinol  (ZYLOPRIM ) 100 MG tablet TAKE 1 TABLET BY MOUTH EVERY DAY 90 tablet 0   amLODipine  (NORVASC ) 5 MG tablet TAKE 1 TABLET BY MOUTH EVERY DAY 90 tablet 0   atenolol  (TENORMIN ) 50 MG tablet TAKE 1 TABLET BY MOUTH EVERY DAY 90 tablet 0   fluticasone  (FLONASE ) 50 MCG/ACT nasal spray Place 2 sprays into both nostrils daily. 16 g 6   hydrOXYzine (ATARAX) 25 MG tablet Take 25 mg by mouth daily.     levonorgestrel  (MIRENA ) 20 MCG/24HR IUD 1 Intra Uterine Device (1 each total) by Intrauterine route once for 1 dose. 1 each 0   PARoxetine  (PAXIL ) 20 MG tablet TAKE 1 TABLET BY MOUTH EVERY DAY 90 tablet 1   traZODone  (DESYREL ) 50 MG tablet TAKE 1-2 TABLETS BY MOUTH AT BEDTIME. 180 tablet 2   triamcinolone ointment (KENALOG) 0.1 % Apply 1 Application topically 2 (two) times daily.     valsartan -hydrochlorothiazide  (DIOVAN -HCT) 80-12.5 MG tablet TAKE 1 TABLET BY MOUTH EVERY DAY 90 tablet 1   No current facility-administered medications for this visit.    Patient confirms/reports the following allergies:  Allergies  Allergen Reactions    Hydrocodone  Itching    Pt can take if benadryl  given   Oxycodone  Itching    Pt can take this if benadryl  is given     No orders of the defined types were placed in this encounter.   AUTHORIZATION INFORMATION Primary Insurance: 1D#: Group #:  Secondary Insurance: 1D#: Group #:  SCHEDULE INFORMATION: Date: 03/30/24 Time: Location: [facility]

## 2024-03-10 NOTE — Telephone Encounter (Signed)
 Pt was contacted to reschedule her colonoscopy due to change with Dr Marinda schedule (4 max pts).   She has been rescheduled to 05/25/24 with Dr. Marinda.  Vikkie in Endo notified.  Thanks,  [place], CMA

## 2024-03-16 ENCOUNTER — Other Ambulatory Visit: Payer: Self-pay | Admitting: Family Medicine

## 2024-03-16 DIAGNOSIS — I10 Essential (primary) hypertension: Secondary | ICD-10-CM

## 2024-04-20 ENCOUNTER — Ambulatory Visit: Payer: Self-pay

## 2024-04-20 NOTE — Telephone Encounter (Signed)
 FYI Only or Action Required?: FYI only for provider.  Patient was last seen in primary care on 02/08/2024 by Kara Nancyann BRAVO, MD.  Called Nurse Triage reporting Chest Pain.  Symptoms began several days ago.  Interventions attempted: OTC medications: ibuprofen .  Symptoms are: intermittent headaches with pain behind both eyes, intermittent chest pain (lasts less than 5 minutes), 1 episode of vomiting on Monday unchanged.  Triage Disposition: Go to ED Now (Notify PCP)  Patient/caregiver understands and will follow disposition?: Yes               Copied from CRM [id number]. Topic: Clinical - Red Word Triage >> Apr 20, 2024 10:54 AM Donna Murray wrote: Red Word that prompted transfer to Nurse Triage: patient has headache tightness behind the eyes, pain in middle of chest that comes and goes, just not feeling good, patient forgot to take BP medication for a few days, Reason for Disposition  Pain also in shoulder(s) or arm(s) or jaw  (Exception: Pain is clearly made worse by movement.)  Answer Assessment - Initial Assessment Questions Patient states she missed her BP medications on Friday, Saturday and Sunday. She states she went back on her medications Monday.   1. LOCATION: Where does it hurt?       Middle of chest.  2. RADIATION: Does the pain go anywhere else? (e.g., into neck, jaw, arms, back)     She states down her back where her shoulders are.  3. ONSET: When did the chest pain begin? (Minutes, hours or days)      Monday morning.  4. PATTERN: Does the pain come and go, or has it been constant since it started?  Does it get worse with exertion?      Comes and goes. She states it soothes the pain if she drinks water .  5. DURATION: How long does it last (e.g., seconds, minutes, hours)      Less than 5 minutes.  6. SEVERITY: How bad is the pain?  (e.g., Scale 1-10; mild, moderate, or severe)     4-5/10, she states she is able to work and do normal  activities.  7. CARDIAC RISK FACTORS: Do you have any history of heart problems or risk factors for heart disease? (e.g., angina, prior heart attack; diabetes, high blood pressure, high cholesterol, smoker, or strong family history of heart disease)     Hypertension, elevated cholesterol, strong family history of heart disease.  8. PULMONARY RISK FACTORS: Do you have any history of lung disease?  (e.g., blood clots in lung, asthma, emphysema, birth control pills)     No.  9. CAUSE: What do you think is causing the chest pain?     She states she thought it could be dehydration because she had a big family party this past weekend.  10. OTHER SYMPTOMS: Do you have any other symptoms? (e.g., dizziness, nausea, vomiting, sweating, fever, difficulty breathing, cough)       Denies SOB or difficulty breathing, blurry vision or loss of vision, changes in speech, facial droop, fever, sweating. Patient states she has also been having an intermittent headache in temples, started on Monday. She states it feels like tightness in the back of her eyes. She states she had 1 episode of vomiting Monday morning (she states she thinks it was because she fell asleep Sunday night and accidentally skipped eating).  11. PREGNANCY: Is there any chance you are pregnant? When was your last menstrual period?       N/A.  Protocols used: Chest Pain-A-AH

## 2024-05-20 ENCOUNTER — Other Ambulatory Visit: Payer: Self-pay

## 2024-05-20 MED ORDER — NA SULFATE-K SULFATE-MG SULF 17.5-3.13-1.6 GM/177ML PO SOLN: 354.0000 mL | Freq: Once | ORAL | 0 refills | Status: AC

## 2024-05-25 ENCOUNTER — Encounter
Admission: RE | Disposition: A | Discharge status: Home / Self Care | Payer: Self-pay | Source: Home / Self Care | Attending: General Surgery

## 2024-05-25 ENCOUNTER — Ambulatory Visit: Admitting: Certified Registered"

## 2024-05-25 ENCOUNTER — Encounter: Payer: Self-pay | Admitting: General Surgery

## 2024-05-25 ENCOUNTER — Ambulatory Visit
Admission: RE | Admit: 2024-05-25 | Discharge: 2024-05-25 | Disposition: A | Discharge status: Home / Self Care | Attending: General Surgery | Admitting: General Surgery

## 2024-05-25 DIAGNOSIS — F1290 Cannabis use, unspecified, uncomplicated: Secondary | ICD-10-CM | POA: Insufficient documentation

## 2024-05-25 DIAGNOSIS — M1990 Unspecified osteoarthritis, unspecified site: Secondary | ICD-10-CM | POA: Diagnosis not present

## 2024-05-25 DIAGNOSIS — Z87891 Personal history of nicotine dependence: Secondary | ICD-10-CM | POA: Diagnosis not present

## 2024-05-25 DIAGNOSIS — K5730 Diverticulosis of large intestine without perforation or abscess without bleeding: Secondary | ICD-10-CM | POA: Insufficient documentation

## 2024-05-25 DIAGNOSIS — I10 Essential (primary) hypertension: Secondary | ICD-10-CM | POA: Diagnosis not present

## 2024-05-25 DIAGNOSIS — Z1211 Encounter for screening for malignant neoplasm of colon: Secondary | ICD-10-CM | POA: Diagnosis not present

## 2024-05-25 HISTORY — PX: COLONOSCOPY: SHX5424

## 2024-05-25 SURGERY — COLONOSCOPY
Anesthesia: General

## 2024-05-25 MED ORDER — PROPOFOL 10 MG/ML IV BOLUS
INTRAVENOUS | Status: DC | PRN
  Administered 2024-05-25: 100 mg via INTRAVENOUS
  Administered 2024-05-25: 50 mg via INTRAVENOUS

## 2024-05-25 MED ORDER — DEXMEDETOMIDINE HCL IN NACL 80 MCG/20ML IV SOLN
INTRAVENOUS | Status: DC | PRN
  Administered 2024-05-25: 10 ug via INTRAVENOUS

## 2024-05-25 MED ORDER — LIDOCAINE 2% (20 MG/ML) 5 ML SYRINGE
INTRAMUSCULAR | Status: DC | PRN
  Administered 2024-05-25: 20 mg via INTRAVENOUS

## 2024-05-25 MED ORDER — MIDAZOLAM HCL 2 MG/2ML IJ SOLN
INTRAMUSCULAR | Status: AC
  Filled 2024-05-25: qty 2

## 2024-05-25 MED ORDER — MIDAZOLAM HCL 5 MG/5ML IJ SOLN
INTRAMUSCULAR | Status: DC | PRN
  Administered 2024-05-25: 2 mg via INTRAVENOUS

## 2024-05-25 MED ORDER — SODIUM CHLORIDE 0.9 % IV SOLN
INTRAVENOUS | Status: DC
  Administered 2024-05-25: 500 mL via INTRAVENOUS

## 2024-05-25 MED ORDER — PROPOFOL 500 MG/50ML IV EMUL
INTRAVENOUS | Status: DC | PRN
  Administered 2024-05-25: 120 ug/kg/min via INTRAVENOUS

## 2024-05-25 NOTE — Op Note (Signed)
 [facility] Gastroenterology Patient Name: Kara Murray Procedure Date: 05/25/2024 8:45 AM MRN: [id number] Account #: [id number] Date of Birth: [birth year] Admit Type: Outpatient Age: 48 Room: [facility] ENDO ROOM 1 Gender: Female Note Status: Finalized Instrument Name: Colon Scope [id number] Procedure:             Colonoscopy Indications:           Screening for colorectal malignant neoplasm Providers:             Jayson KIDD. Marinda, MD Referring MD:          Nancyann BRAVO. Gasper, MD (Referring MD) Medicines:             See the Anesthesia note for documentation of the                         administered medications Complications:         No immediate complications. Estimated blood loss: None. Procedure:             Pre-Anesthesia Assessment:                        - Prior to the procedure, a History and Physical was                         performed, and patient medications and allergies were                         reviewed. The patient's tolerance of previous                         anesthesia was also reviewed. The risks and benefits                         of the procedure and the sedation options and risks                         were discussed with the patient. All questions were                         answered, and informed consent was obtained. Prior                         Anticoagulants: The patient has taken no anticoagulant                         or antiplatelet agents. ASA Grade Assessment: III - A                         patient with severe systemic disease. After reviewing                         the risks and benefits, the patient was deemed in                         satisfactory condition to undergo the procedure.                        After obtaining informed consent, the colonoscope was  passed under direct vision. Throughout the procedure,                         the patient's blood pressure, pulse, and oxygen                          saturations were monitored continuously. The                         Colonoscope was introduced through the anus and                         advanced to the the cecum, identified by appendiceal                         orifice and ileocecal valve. The colonoscopy was                         performed with ease. The patient tolerated the                         procedure well. The quality of the bowel preparation                         was excellent. Findings:      The perianal and digital rectal examinations were normal.      A few small-mouthed diverticula were found in the colon.      The exam was otherwise without abnormality on direct and retroflexion       views. Impression:            - Diverticulosis.                        - The examination was otherwise normal on direct and                         retroflexion views.                        - No specimens collected. Recommendation:        - Discharge patient to home.                        - Resume previous diet.                        - Repeat colonoscopy in 10 years for screening                         purposes. Procedure Code(s):     --- Professional ---                        [phone], Colonoscopy, flexible; diagnostic, including                         collection of specimen(s) by brushing or washing, when                         performed (separate procedure) CPT copyright 2022 American Medical Association. All rights  reserved. The codes documented in this report are preliminary and upon coder review may  be revised to meet current compliance requirements. Jayson MALVA Endow, MD 05/25/2024 9:31:28 AM Number of Addenda: 0 Note Initiated On: 05/25/2024 8:45 AM Scope Withdrawal Time: 0 hours 16 minutes 24 seconds  Total Procedure Duration: 0 hours 22 minutes 49 seconds  Estimated Blood Loss:  Estimated blood loss: none.      [facility]

## 2024-05-25 NOTE — Discharge Instructions (Signed)
 YOU HAD AN ENDOSCOPIC PROCEDURE TODAY: Refer to the procedure report that was given to you for any specific questions about what was found during the examination.  If the procedure report does not answer your questions, please call your gastroenterologist to clarify.  YOU SHOULD EXPECT: Some feelings of bloating in the abdomen. Passage of more gas than usual.  Walking can help get rid of the air that was put into your GI tract during the procedure and reduce the bloating. If you had a lower endoscopy (such as a colonoscopy or flexible sigmoidoscopy) you may notice spotting of blood in your stool or on the toilet paper.   DIET: Your first meal following the procedure should be a light meal and then it is ok to progress to your normal diet.  A half-sandwich or bowl of soup is an example of a good first meal.  Heavy or fried foods are harder to digest and may make you feel nasueas or bloated.  Drink plenty of fluids but you should avoid alcoholic beverages for 24 hours.  ACTIVITY: Your care partner should take you home directly after the procedure.  You should plan to take it easy, moving slowly for the rest of the day.  You can resume normal activity the day after the procedure however you should NOT DRIVE, make legal decisions or use heavy machinery for 24 hours (because of the sedation medicines used during the test).    SYMPTOMS TO REPORT IMMEDIATELY  A gastroenterologist can be reached at any hour.  Please call your doctor's office for any of the following symptoms:  Following lower endoscopy (colonoscopy, flexible sigmoidoscopy)  Excessive amounts of blood in the stool  Significant tenderness, worsening of abdominal pains  Swelling of the abdomen that is new, acute  Fever of 100 or higher Following upper endoscopy (EGD, EUS, ERCP)  Vomiting of blood or coffee ground material  New, significant abdominal pain  New, significant chest pain or pain under the shoulder blades  Painful or  persistently difficult swallowing  New shortness of breath  Black, tarry-looking stools  FOLLOW UP: If any biopsies were taken you will be contacted by phone or by letter within the next 1-3 weeks.  Call your gastroenterologist if you have not heard about the biopsies in 3 weeks.   Please also call your gastroenterologist's office with any specific questions about appointments or follow up tests.

## 2024-05-25 NOTE — H&P (Signed)
 Primary Care Physician:  Gasper Nancyann BRAVO, MD Primary Gastroenterologist:  Dr. Marinda  Pre-Procedure History & Physical: HPI:  Kara Murray is a 48 y.o. female is here for an colonoscopy.   Past Medical History:  Diagnosis Date   Abnormal LFTs    Alcohol abuse    Anemia    Arthritis    Gout    Hypertension    Hypokalemia    Hyponatremia    Marijuana abuse    Sickle cell trait [facility])     Past Surgical History:  Procedure Laterality Date   biariactric surgery  09/08/2008   JOINT REPLACEMENT     KNEE SURGERY Right 1994, 2001   THUMB FUSION Left 04/11/2014   TOE SURGERY  09/08/2006   TOTAL KNEE ARTHROPLASTY Right 08/14/2022   Procedure: TOTAL KNEE ARTHROPLASTY;  Surgeon: Lorelle Hussar, MD;  Location: [facility] ORS;  Service: Orthopedics;  Laterality: Right;    Prior to Admission medications   Medication Sig Start Date End Date Taking? Authorizing Provider  allopurinol  (ZYLOPRIM ) 100 MG tablet TAKE 1 TABLET BY MOUTH EVERY DAY 11/22/23  Yes Gasper Nancyann BRAVO, MD  amLODipine  (NORVASC ) 5 MG tablet TAKE 1 TABLET BY MOUTH EVERY DAY 03/17/24  Yes Gasper Nancyann BRAVO, MD  atenolol  (TENORMIN ) 50 MG tablet TAKE 1 TABLET BY MOUTH EVERY DAY 03/17/24  Yes Gasper Nancyann BRAVO, MD  fluticasone  (FLONASE ) 50 MCG/ACT nasal spray Place 2 sprays into both nostrils daily. 10/29/22  Yes Gasper Nancyann BRAVO, MD  hydrOXYzine (ATARAX) 25 MG tablet Take 25 mg by mouth daily. 01/26/24  Yes [provider]  PARoxetine  (PAXIL ) 20 MG tablet TAKE 1 TABLET BY MOUTH EVERY DAY 02/14/24  Yes Gasper Nancyann BRAVO, MD  traZODone  (DESYREL ) 50 MG tablet TAKE 1-2 TABLETS BY MOUTH AT BEDTIME. 02/14/24  Yes Gasper Nancyann BRAVO, MD  triamcinolone ointment (KENALOG) 0.1 % Apply 1 Application topically 2 (two) times daily. 10/28/23  Yes [provider]  valsartan -hydrochlorothiazide  (DIOVAN -HCT) 80-12.5 MG tablet TAKE 1 TABLET BY MOUTH EVERY DAY 08/31/23  Yes Gasper Nancyann BRAVO, MD  levonorgestrel  (MIRENA ) 20 MCG/24HR IUD 1  Intra Uterine Device (1 each total) by Intrauterine route once for 1 dose. 01/06/18 09/13/23  Copland, Alicia B, PA-C    Allergies as of 02/18/2024 - Review Complete 02/08/2024  Allergen Reaction Noted   Hydrocodone  Itching 08/06/2022   Oxycodone  Itching 08/06/2022    Family History  Problem Relation Age of Onset   Hypertension Mother    Diabetes Mother    Heart attack Father    Hypertension Brother    Breast cancer Maternal Aunt 90   Breast cancer Cousin     Social History   Socioeconomic History   Marital status: Widowed    Spouse name: Not on file   Number of children: Not on file   Years of education: Not on file   Highest education level: Not on file  Occupational History   Not on file  Tobacco Use   Smoking status: Former    Current packs/day: 0.00    Types: Cigarettes    Quit date: 2019    Years since quitting: 6.7   Smokeless tobacco: Never   Tobacco comments:    socially  Vaping Use   Vaping status: Never Used  Substance and Sexual Activity   Alcohol use: Yes    Alcohol/week: 2.0 standard drinks of alcohol    Types: 2 Cans of beer per week    Comment: socially    Drug use: Yes  Types: Marijuana   Sexual activity: Not Currently    Birth control/protection: I.U.D.    Comment: Mirena    Other Topics Concern   Not on file  Social History Narrative   Not on file   Social Drivers of Health   Financial Resource Strain: Low Risk  (01/27/2024)   Received from [facility]   Overall Financial Resource Strain (CARDIA)    Difficulty of Paying Living Expenses: Not very hard  Food Insecurity: No Food Insecurity (01/27/2024)   Received from [facility]   Hunger Vital Sign    Within the past 12 months, you worried that your food would run out before you got the money to buy more.: Never true    Within the past 12 months, the food you bought just didn't last and you didn't have money to get more.: Never true  Transportation  Needs: No Transportation Needs (01/27/2024)   Received from [facility] - Transportation    In the past 12 months, has lack of transportation kept you from medical appointments or from getting medications?: No    Lack of Transportation (Non-Medical): No  Physical Activity: Not on file  Stress: Not on file  Social Connections: Not on file  Intimate Partner Violence: Not At Risk (08/14/2022)   Humiliation, Afraid, Rape, and Kick questionnaire    Fear of Current or Ex-Partner: No    Emotionally Abused: No    Physically Abused: No    Sexually Abused: No    Review of Systems: See HPI, otherwise negative ROS  Physical Exam: BP (!) 151/99   Pulse 79   Temp (!) 96.3 F (35.7 C) (Temporal)   Resp 18   Ht 5' 2 (1.575 m)   Wt 87.5 kg   SpO2 100%   BMI 35.30 kg/m  General:   Alert,  pleasant and cooperative in NAD Head:  Normocephalic and atraumatic. Neck:  Supple; no masses or thyromegaly. Lungs:  Clear throughout to auscultation.    Heart:  Regular rate and rhythm. Abdomen:  Soft, nontender and nondistended. Normal bowel sounds, without guarding, and without rebound.   Neurologic:  Alert and  oriented x4;  grossly normal neurologically.  Impression/Plan: Kara Murray is here for an colonoscopy to be performed for screening  Risks, benefits, limitations, and alternatives regarding  colonoscopy have been reviewed with the patient.  Questions have been answered.  All parties agreeable.   Jayson MALVA Endow, MD  05/25/2024, 8:54 AM

## 2024-05-25 NOTE — Transfer of Care (Signed)
 Immediate Anesthesia Transfer of Care Note  Patient: Kara Murray  Procedure(s) Performed: COLONOSCOPY  Patient Location: Endoscopy Unit  Anesthesia Type:General  Level of Consciousness: awake and alert   Airway & Oxygen Therapy: Patient Spontanous Breathing  Post-op Assessment: Report given to RN and Post -op Vital signs reviewed and stable  Post vital signs: Reviewed  Last Vitals:  Vitals Value Taken Time  BP 118/78 05/25/24 09:31  Temp    Pulse 77 05/25/24 09:31  Resp 20 05/25/24 09:31  SpO2 100 % 05/25/24 09:31  Vitals shown include unfiled device data.  Last Pain:  Vitals:   05/25/24 0835  TempSrc: Temporal  PainSc: 0-No pain         Complications: No notable events documented.

## 2024-05-25 NOTE — Anesthesia Preprocedure Evaluation (Signed)
 Anesthesia Evaluation  Patient identified by MRN, date of birth, ID band Patient awake    Reviewed: Allergy & Precautions, NPO status , Patient's Chart, lab work & pertinent test results  History of Anesthesia Complications Negative for: history of anesthetic complications  Airway Mallampati: II  TM Distance: >3 FB Neck ROM: full    Dental  (+) Chipped, Dental Advidsory Given   Pulmonary neg pulmonary ROS, former smoker   Pulmonary exam normal        Cardiovascular hypertension, On Medications and On Home Beta Blockers (-) angina (-) Past MI and (-) Cardiac Stents negative cardio ROS Normal cardiovascular exam(-) dysrhythmias (-) Valvular Problems/Murmurs     Neuro/Psych negative neurological ROS  negative psych ROS   GI/Hepatic negative GI ROS,,,(+)     substance abuse  alcohol use  Endo/Other  negative endocrine ROS    Renal/GU      Musculoskeletal  (+) Arthritis ,    Abdominal   Peds  Hematology negative hematology ROS (+) Sickle cell trait    Anesthesia Other Findings Past Medical History: No date: Abnormal LFTs No date: Alcohol abuse No date: Anemia No date: Arthritis No date: Gout No date: Hypertension No date: Hypokalemia No date: Hyponatremia No date: Marijuana abuse No date: Sickle cell trait [facility])  Past Surgical History: 09/08/2008: biariactric surgery 1994, 2001: KNEE SURGERY; Right 04/11/2014: THUMB FUSION; Left 09/08/2006: TOE SURGERY     Reproductive/Obstetrics negative OB ROS                              Anesthesia Physical Anesthesia Plan  ASA: 3  Anesthesia Plan: General   Post-op Pain Management: Minimal or no pain anticipated   Induction: Intravenous  PONV Risk Score and Plan: 3 and Propofol  infusion, TIVA and Treatment may vary due to age or medical condition  Airway Management Planned: Natural Airway and Nasal Cannula  Additional  Equipment:   Intra-op Plan:   Post-operative Plan:   Informed Consent: I have reviewed the patients History and Physical, chart, labs and discussed the procedure including the risks, benefits and alternatives for the proposed anesthesia with the patient or authorized representative who has indicated his/her understanding and acceptance.     Dental Advisory Given  Plan Discussed with: Anesthesiologist, CRNA and Surgeon  Anesthesia Plan Comments: (Patient consented for risks of anesthesia including but not limited to:  - adverse reactions to medications - risk of bleeding, infection and or nerve damage from epidural that could lead to paralysis - risk of headache or failed epidural - nerve damage due to positioning - that if epidural is used for C-section that there is a chance of epidural failure requiring spinal placement or conversion to GA - Damage to heart, brain, lungs, other parts of body or loss of life  Patient voiced understanding.)         Anesthesia Quick Evaluation

## 2024-05-26 NOTE — Anesthesia Postprocedure Evaluation (Signed)
 Anesthesia Post Note  Patient: Kara Murray  Procedure(s) Performed: COLONOSCOPY  Patient location during evaluation: Endoscopy Anesthesia Type: General Level of consciousness: awake and alert Pain management: pain level controlled Vital Signs Assessment: post-procedure vital signs reviewed and stable Respiratory status: spontaneous breathing, nonlabored ventilation, respiratory function stable and patient connected to nasal cannula oxygen Cardiovascular status: blood pressure returned to baseline and stable Postop Assessment: no apparent nausea or vomiting Anesthetic complications: no   No notable events documented.   Last Vitals:  Vitals:   05/25/24 0931 05/25/24 0950  BP: 118/78 132/70  Pulse:    Resp:  16  Temp:    SpO2:      Last Pain:  Vitals:   05/26/24 0744  TempSrc:   PainSc: 0-No pain                 Prentice Murphy

## 2024-07-25 ENCOUNTER — Encounter: Payer: Self-pay | Admitting: Physician Assistant

## 2024-07-25 ENCOUNTER — Ambulatory Visit: Payer: Self-pay | Admitting: Physician Assistant

## 2024-07-25 MED ORDER — NAPROXEN 500 MG PO TABS: 500.0000 mg | ORAL_TABLET | Freq: Two times a day (BID) | ORAL | 0 refills | Status: DC

## 2024-07-25 MED ORDER — ORPHENADRINE CITRATE ER 100 MG PO TB12
100.0000 mg | ORAL_TABLET | Freq: Two times a day (BID) | ORAL | 0 refills | Status: AC
Start: 2024-07-25 — End: ?
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
## 2024-08-01 ENCOUNTER — Encounter: Payer: Self-pay | Admitting: Licensed Practical Nurse

## 2024-08-01 ENCOUNTER — Ambulatory Visit: Admitting: Licensed Practical Nurse

## 2024-08-01 ENCOUNTER — Other Ambulatory Visit (HOSPITAL_COMMUNITY)
Admission: RE | Admit: 2024-08-01 | Discharge: 2024-08-01 | Disposition: A | Discharge status: Home / Self Care | Source: Ambulatory Visit | Attending: Licensed Practical Nurse | Admitting: Licensed Practical Nurse

## 2024-08-01 VITALS — BP 130/95 | HR 85 | Ht 62.0 in | Wt 193.6 lb

## 2024-08-01 DIAGNOSIS — Z01419 Encounter for gynecological examination (general) (routine) without abnormal findings: Secondary | ICD-10-CM | POA: Insufficient documentation

## 2024-08-01 DIAGNOSIS — Z113 Encounter for screening for infections with a predominantly sexual mode of transmission: Secondary | ICD-10-CM | POA: Insufficient documentation

## 2024-08-01 DIAGNOSIS — Z124 Encounter for screening for malignant neoplasm of cervix: Secondary | ICD-10-CM | POA: Insufficient documentation

## 2024-08-01 NOTE — Progress Notes (Signed)
 Gynecology Annual Exam  PCP: Gasper Nancyann BRAVO, MD  Chief Complaint:  Chief Complaint  Patient presents with   Gynecologic Exam    History of Present Illness: Patient is a 48 y.o. H5E7977 presents for annual exam. The patient has no complaints today.  Was previously a [facility] pt. Has not been seen in sometime  IUD placed in 2019   LMP: No LMP recorded. (Menstrual status: IUD).    The patient is sexually active 1 female partner. She currently uses IUD for contraception. She denies dyspareunia.  The patient does perform self breast exams.  There is notable family history of breast or ovarian cancer in her family.  Hx of HTN, last took her med just before she arrived, concerned she may have accidentally  taken her sleeping medication as her blood pressure pill is still in her pocket book and she is feeling sleepy.   Perimenopause: hot at night, day sweat, hot flashes, sleep disturbances for about 1 year  Her mother went into early menopause   The patient wears seatbelts: yes.   The patient has regular exercise: yes.  Walking, coach basketball and soft ball   The patient reports current symptoms of depression.  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   Electrolyte disturbance 07/09/2022   Insomnia 07/09/2022   Alcohol abuse 07/09/2022   Hyponatremia 07/09/2022   Hypokalemia 07/09/2022   Chronic diarrhea 07/09/2022   Abnormal LFTs 07/09/2022   Sickle cell trait  01/20/2022   Elevated ferritin 01/20/2022   Hypertriglyceridemia 01/08/2022   History of anemia 01/08/2022   Family history of diabetes mellitus (DM) 01/08/2022   History of gout 01/08/2022   Lateral epicondylitis, left elbow 04/13/2020   Pain in right knee 01/26/2018   Osteoarthritis of knee 11/13/2017   Localized, primary osteoarthritis 11/13/2017   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   Hypertension 05/29/2015    Past Surgical History:  Past Surgical History:  Procedure Laterality Date   biariactric surgery  09/08/2008   COLONOSCOPY N/A 05/25/2024   Procedure: COLONOSCOPY;  Surgeon: Marinda Jayson KIDD, MD;  Location: [facility] ENDOSCOPY;  Service: General;  Laterality: N/A;   JOINT REPLACEMENT     KNEE SURGERY Right 1994, 2001   THUMB FUSION Left 04/11/2014   TOE SURGERY  09/08/2006   TOTAL KNEE ARTHROPLASTY Right 08/14/2022   Procedure: TOTAL KNEE ARTHROPLASTY;  Surgeon: Lorelle Hussar, MD;  Location: [facility] ORS;  Service: Orthopedics;  Laterality: Right;    Gynecologic History:  No LMP recorded. (Menstrual status: IUD). Contraception: IUD Last Pap: Results were: 2019 no abnormalities  Last mammogram: 2025 Results were: results not visible   Obstetric History: [id number]  Family History:  Family History  Problem Relation Age of Onset   Hypertension Mother    Diabetes Mother    Heart attack Father    Hypertension Brother    Breast cancer Maternal Aunt 68   Breast cancer Cousin     Social History:  Social History   Socioeconomic History   Marital status: Widowed    Spouse name: Not on file   Number of children: Not on file   Years of education: Not on file   Highest education level: Not on file  Occupational History   Not on file  Tobacco Use   Smoking status: Former    Current packs/day: 0.00    Types: Cigarettes    Quit date: 2019    Years since quitting: 6.9   Smokeless tobacco: Never   Tobacco comments:    socially  Vaping Use   Vaping status: Never Used   Substance and Sexual Activity   Alcohol use: Yes    Alcohol/week: 2.0 standard drinks of alcohol    Types: 2 Cans of beer per week    Comment: socially    Drug use: Not Currently    Types: Marijuana   Sexual activity: Yes    Birth control/protection: I.U.D.    Comment: Mirena    Other Topics Concern   Not on file  Social History Narrative   Not on file   Social Drivers of Health   Financial Resource Strain: Low Risk  (01/27/2024)   Received [facility]   Overall Financial Resource Strain (CARDIA)    Difficulty of Paying Living Expenses: Not very hard  Food Insecurity: No Food Insecurity (01/27/2024)   Received from [facility]   Hunger Vital Sign    Within the past 12 months, you worried that your food would run out before you got the money to buy more.: Never true    Within the past 12 months, the food you bought just didn't last and you didn't have money to get more.: Never true  Transportation Needs: No Transportation Needs (01/27/2024)   Received from [facility] - Transportation    In the past 12 months, has lack of transportation kept you from medical appointments or from getting medications?: No    Lack of Transportation (Non-Medical): No  Physical Activity: Not on file  Stress: Not on file  Social Connections: Not on file  Intimate Partner Violence: Not At Risk (08/14/2022)   Humiliation, Afraid, Rape, and Kick questionnaire    Fear of Current or Ex-Partner: No    Emotionally Abused: No    Physically Abused: No    Sexually Abused: No    Allergies:  Allergies  Allergen Reactions   Hydrocodone  Itching    Pt can take if benadryl  given   Oxycodone  Itching    Pt can take this if benadryl  is given     Medications: Prior to Admission medications   Medication Sig Start Date End Date Taking? Authorizing Provider  allopurinol  (ZYLOPRIM ) 100 MG tablet TAKE 1 TABLET BY MOUTH EVERY DAY 11/22/23   Gasper Nancyann BRAVO, MD  amLODipine  (NORVASC ) 5 MG tablet TAKE 1 TABLET BY MOUTH EVERY DAY 03/17/24   Gasper Nancyann BRAVO, MD  atenolol  (TENORMIN ) 50 MG tablet TAKE 1 TABLET BY MOUTH EVERY DAY 03/17/24   Gasper Nancyann BRAVO, MD  fluticasone  (FLONASE ) 50 MCG/ACT nasal spray Place 2 sprays into both nostrils daily. 10/29/22   Gasper Nancyann BRAVO, MD  hydrOXYzine (ATARAX) 25 MG tablet Take 25 mg by mouth daily. 01/26/24   [provider]  levonorgestrel  (MIRENA ) 20 MCG/24HR IUD 1 Intra Uterine Device (1 each total) by Intrauterine route once for 1 dose. 01/06/18 07/25/24  Copland, Alicia B, PA-C  naproxen  (NAPROSYN ) 500 MG tablet Take  1 tablet (500 mg total) by mouth 2 (two) times daily with a meal. 07/25/24   Claudene Tanda POUR, PA-C  orphenadrine  (NORFLEX ) 100 MG tablet Take 1 tablet (100 mg total) by mouth 2 (two) times daily. 07/25/24   Claudene Tanda POUR, PA-C  PARoxetine  (PAXIL ) 20 MG tablet TAKE 1 TABLET BY MOUTH EVERY DAY 02/14/24   Gasper Nancyann BRAVO, MD  traZODone  (DESYREL ) 50 MG tablet TAKE 1-2 TABLETS BY MOUTH AT BEDTIME. 02/14/24   Gasper Nancyann BRAVO, MD  triamcinolone ointment (KENALOG) 0.1 % Apply 1 Application topically 2 (two) times daily. Patient not taking: Reported on 07/25/2024 10/28/23   [provider]  valsartan -hydrochlorothiazide  (DIOVAN -HCT) 80-12.5 MG tablet TAKE 1 TABLET BY MOUTH EVERY DAY 08/31/23   Gasper Nancyann BRAVO, MD    Physical Exam Vitals: There were no vitals taken for this visit.  General: NAD HEENT: normocephalic, anicteric Thyroid: no enlargement, no palpable nodules Pulmonary: No increased work of breathing, CTAB Cardiovascular: RRR, distal pulses 2+ Breast: Breast symmetrical, no tenderness, no palpable nodules or masses, no skin or nipple retraction present, no nipple discharge.  No axillary or supraclavicular lymphadenopathy. Abdomen: NABS, soft, non-tender, non-distended.  Umbilicus without lesions.  No hepatomegaly, splenomegaly or masses palpable. No evidence of hernia   Genitourinary:  External: Normal external female genitalia.  Normal urethral meatus, normal Bartholin's and Skene's glands.    Vagina: Normal vaginal mucosa, no evidence of prolapse.  Good tone   Cervix: Grossly normal in appearance, no bleeding, IUD strings present   Uterus: Non-enlarged, mobile, normal contour.  No CMT  Adnexa: ovaries non-enlarged, no adnexal masses  Rectal: deferred  Lymphatic: no evidence of inguinal lymphadenopathy Extremities: no edema, erythema, or tenderness Neurologic: Grossly intact Psychiatric: mood appropriate, affect full   Assessment: 48 y.o. H5E7977 routine annual exam  Plan: Problem List Items Addressed This Visit   None Visit Diagnoses       Well woman exam    -  Primary   Relevant Orders   Cytology - PAP     Cervical cancer screening       Relevant Orders   Cytology - PAP     Screening examination for venereal disease       Relevant Orders   Cytology - PAP       1) Mammogram - recommend yearly screening mammogram.  Mammogram Is up to date   2) STI screening  wasoffered and accepted 5 years   3) ASCCP guidelines and rational discussed.  Patient opts for every 5 years screening interval  4) Contraception - the patient is currently using  IUD.  She is happy with her current form of contraception and plans to continue due to to be switched on 2027  5) Colonoscopy -up to date- Screening recommended starting at age 73 for average risk individuals, age 38 for individuals deemed at increased risk (including African Americans) and recommended to continue until age 36.  For patient age 84-85 individualized approach is recommended.  Gold standard screening is via colonoscopy, Cologuard screening is an acceptable alternative for patient unwilling or unable to undergo colonoscopy.  Colorectal cancer screening for average?risk adults: 2018 guideline update from the American Cancer SocietyCA: A Cancer Journal for Clinicians: Feb 04, 2017   6)  Routine healthcare maintenance including cholesterol, diabetes screening discussed managed by PCP  7) RTC in 1 year, sooner if your desire HRTor have other concerns,   Menopause info placed in AVS   Jinnie Cookey, CNM  [facility] 08/01/2024, 4:57  PM

## 2024-08-01 NOTE — Patient Instructions (Addendum)
 Resources for Menopause: [email or web address]  [email or web address].apple.com/us /[email or web address]  You should be dong weight bearing exercise Consider daily Calcium  and Vitamin D supplement  Make an appointment if  you would like to talk about hormone replacement

## 2024-08-05 LAB — CYTOLOGY - PAP
Chlamydia: NEGATIVE
Comment: NEGATIVE
Comment: NEGATIVE
Comment: NORMAL
Diagnosis: NEGATIVE
High risk HPV: NEGATIVE
Neisseria Gonorrhea: NEGATIVE

## 2024-08-22 ENCOUNTER — Ambulatory Visit
Admission: EM | Admit: 2024-08-22 | Discharge: 2024-08-22 | Disposition: A | Discharge status: Home / Self Care | Attending: Emergency Medicine | Admitting: Emergency Medicine

## 2024-08-22 DIAGNOSIS — H6123 Impacted cerumen, bilateral: Secondary | ICD-10-CM | POA: Diagnosis not present

## 2024-08-22 DIAGNOSIS — J069 Acute upper respiratory infection, unspecified: Secondary | ICD-10-CM

## 2024-08-22 DIAGNOSIS — H9203 Otalgia, bilateral: Secondary | ICD-10-CM

## 2024-08-22 DIAGNOSIS — Z20822 Contact with and (suspected) exposure to covid-19: Secondary | ICD-10-CM

## 2024-08-22 LAB — POCT INFLUENZA A/B
Influenza A, POC: NEGATIVE
Influenza B, POC: NEGATIVE

## 2024-08-22 LAB — POCT RAPID STREP A (OFFICE): Rapid Strep A Screen: NEGATIVE

## 2024-08-22 LAB — POC SOFIA SARS ANTIGEN FIA: SARS Coronavirus 2 Ag: NEGATIVE

## 2024-08-22 MED ORDER — OFLOXACIN 0.3 % OT SOLN: 5.0000 [drp] | Freq: Two times a day (BID) | OTIC | 0 refills | Status: AC

## 2024-08-22 MED ORDER — IPRATROPIUM BROMIDE 0.06 % NA SOLN: 2.0000 | Freq: Four times a day (QID) | NASAL | 0 refills | Status: AC

## 2024-08-22 MED ORDER — AMOXICILLIN-POT CLAVULANATE 875-125 MG PO TABS: 1.0000 | ORAL_TABLET | Freq: Two times a day (BID) | ORAL | 0 refills | Status: AC

## 2024-08-22 MED ORDER — ONDANSETRON 8 MG PO TBDP: ORAL_TABLET | ORAL | 0 refills | Status: AC

## 2024-08-22 MED ORDER — HYDROCOD POLI-CHLORPHE POLI ER 10-8 MG/5ML PO SUER: 5.0000 mL | Freq: Two times a day (BID) | ORAL | 0 refills | Status: AC | PRN

## 2024-08-22 NOTE — Discharge Instructions (Addendum)
 COVID, flu, strep negative.  Start Mucinex to keep the mucous thin and to decongest you.   You may take 1000 mg of tylenol  up to 3-4 times a day as needed for pain.  Atrovent  nasal spray for postnasal drip and nasal congestion.  Most sinus infections are viral and do not need antibiotics unless you have a high fever, have had this for 10 days, or you get better and then get sick again. Use a NeilMed sinus rinse with distilled water  as often as you want to to reduce nasal congestion. Follow the directions on the box.   Tussionex for cough.  Zofran  if you have any further episodes of nausea or vomiting.  Wait-and-see prescription of Augmentin .  For an ear infection.  We were unable to get the wax out so I was unable to see if you have 1 or not.  You will need to follow-up with ENT to have the wax removed.  Ofloxacin  eardrops for the scrape in your right ear canal.   Go to www.goodrx.com to look up your medications. This will give you a list of where you can find your prescriptions at the most affordable prices. Or you can ask the pharmacist what the cash price is. This is frequently cheaper than going through insurance.

## 2024-08-22 NOTE — ED Triage Notes (Signed)
 sore throat headache sneezing cough bodyache 3 days   Not taking any OTC meds.

## 2024-08-22 NOTE — ED Provider Notes (Addendum)
HPI  SUBJECTIVE:  Kara Murray is a 48 y.o. female who presents with 3 days of body aches, nasal congestion, rhinorrhea, sinus pain and pressure, bilateral ear pain, sore throat, postnasal drip, nonproductive cough and states that her eyes were matted shut this morning.  She reports nausea with 1 episode of nonbilious nonbloody emesis and diarrhea starting this morning.  Has not tried to have anything to eat or drink since.  No fevers, upper dental pain, facial swelling, wheezing, shortness of breath, abdominal pain.  No known COVID or flu exposure, but she works as a tax adviser.  She got 1 dose of the COVID-vaccine.  She did not get this years flu vaccine.  No antipyretic in the past 6 hours.  No antibiotics in the past 3 months.  She tried ibuprofen  and resting.  The resting helps.  No aggravating factors.  She has a past medical history of hypertension, chronic diarrhea, gout, status post bariatric surgery, tonsillectomy, recurrent otitis media followed by ENT.  LMP: None.  Has an IUD.  Denies the possibility of being pregnant.  PCP: [facility]  Past Medical History:  Diagnosis Date   Abnormal LFTs    Alcohol abuse    Anemia    Arthritis    Gout    Hypertension    Hypokalemia    Hyponatremia    Marijuana abuse    Sickle cell trait     Past Surgical History:  Procedure Laterality Date   biariactric surgery  09/08/2008   COLONOSCOPY N/A 05/25/2024   Procedure: COLONOSCOPY;  Surgeon: Marinda Jayson KIDD, MD;  Location: [facility] ENDOSCOPY;  Service: General;  Laterality: N/A;   JOINT REPLACEMENT     KNEE SURGERY Right 1994, 2001   THUMB FUSION Left 04/11/2014   TOE SURGERY  09/08/2006   TOTAL KNEE ARTHROPLASTY Right 08/14/2022   Procedure: TOTAL KNEE ARTHROPLASTY;  Surgeon: Lorelle Hussar, MD;  Location: [facility] ORS;  Service: Orthopedics;  Laterality: Right;    Family History  Problem Relation Age of Onset   Hypertension Mother    Diabetes Mother    Heart  attack Father    Hypertension Brother    Breast cancer Maternal Aunt 4   Breast cancer Cousin     Social History[1]  Current Medications[2]  Allergies[3]   ROS  As noted in HPI.   Physical Exam  BP (!) 138/93 (BP Location: Left Arm)   Pulse 96   Temp 98.9 F (37.2 C) (Oral)   Resp 18   Wt 87.1 kg   SpO2 100%   BMI 35.12 kg/m   Constitutional: Well developed, well nourished, no acute distress Eyes: PERRL, EOMI, conjunctiva normal bilaterally HENT: Normocephalic, atraumatic,mucus membranes moist.  Bilateral cerumen impaction.  Erythematous, swollen turbinates.  Positive nasal congestion.  Friable nasal mucosa.  No maxillary, frontal sinus tenderness.  Tonsils surgically absent.  Extensive postnasal drip. Neck: Positive tender cervical lymphadenopathy Respiratory: Clear to auscultation bilaterally, no rales, no wheezing, no rhonchi, no anterior, lateral chest wall tenderness Cardiovascular: Normal rate and rhythm, no murmurs, no gallops, no rubs GI: nondistended soft, nontender, active bowel sounds, no rebound, guarding, splenomegaly skin: No rash, skin intact Musculoskeletal: no deformities Neurologic: Alert & oriented x 3, CN III-XII grossly intact, no motor deficits, sensation grossly intact Psychiatric: Speech and behavior appropriate   ED Course   Medications - No data to display  Orders Placed This Encounter  Procedures   Ear wax removal    Bilateral ears    Standing  Status:   Standing    Number of Occurrences:   1   POC Influenza A/B    Standing Status:   Standing    Number of Occurrences:   1   POC SARS Coronavirus Ag    Standing Status:   Standing    Number of Occurrences:   1   POC rapid strep A    Standing Status:   Standing    Number of Occurrences:   1   Results for orders placed or performed during the hospital encounter of 08/22/24 (from the past 24 hours)  POC rapid strep A     Status: Normal   Collection Time: 08/22/24 11:33 AM  Result  Value Ref Range   Rapid Strep A Screen Negative Negative  POC Influenza A/B     Status: Normal   Collection Time: 08/22/24 11:48 AM  Result Value Ref Range   Influenza A, POC Negative Negative   Influenza B, POC Negative Negative  POC SARS Coronavirus Ag     Status: Normal   Collection Time: 08/22/24 11:48 AM  Result Value Ref Range   SARS Coronavirus 2 Ag Negative Negative   No results found.  ED Clinical Impression  1. Viral URI with cough   2. Lab test negative for COVID-19 virus   3. Bilateral impacted cerumen   4. Otalgia of both ears      ED Assessment/Plan    Patient declined Zofran  here.  COVID, flu, strep negative.  Discussed with patient while in department.  She has a bilateral cerumen impaction.  Will have the ears irrigated out and reevaluate.    narcotic database reviewed.  No opiate prescriptions since 2023.  Unable to prescribe promethazine  DM as interacts with the Paxil .  After obtaining verbal consent, I attempted to remove wax in the right ear with a curette.  I was able to remove some wax, but accidentally caused a small abrasion in the external ear canal.  Discussed this with patient.  Will send home with oxfloxacin eardrops.  Presentation consistent with a viral respiratory infection/viral sinusitis.  Home with saline nasal irrigation, Mucinex, Atrovent  nasal spray, Tylenol  1000 milligrams 3-4 times a day as needed, Tussionex, work note.  Zofran  if she has any further episodes of nausea or vomiting.  Cannot tell if she has an otitis media or not due to cerumen impaction.  Given that she has frequent ear infections, will send home with a wait-and-see prescription of Augmentin  for her to take if her ear pain starts to get worse.  This will also take care of a sinusitis.  She will need to follow-up with ENT for cerumen removal.  Discussed labs,  MDM, treatment plan, and plan for follow-up with patient Discussed sn/sx that should prompt return to  the ED. patient agrees with plan.   Meds ordered this encounter  Medications   ondansetron  (ZOFRAN -ODT) 8 MG disintegrating tablet    Sig: 1/2- 1 tablet q 8 hr prn nausea, vomiting    Dispense:  20 tablet    Refill:  0   ipratropium (ATROVENT ) 0.06 % nasal spray    Sig: Place 2 sprays into both nostrils 4 (four) times daily.    Dispense:  15 mL    Refill:  0   chlorpheniramine-HYDROcodone  (TUSSIONEX) 10-8 MG/5ML    Sig: Take 5 mLs by mouth every 12 (twelve) hours as needed for cough.    Dispense:  60 mL    Refill:  0   ofloxacin  (FLOXIN )  0.3 % OTIC solution    Sig: Place 5 drops into the right ear 2 (two) times daily. X 7 days    Dispense:  10 mL    Refill:  0   amoxicillin -clavulanate (AUGMENTIN ) 875-125 MG tablet    Sig: Take 1 tablet by mouth every 12 (twelve) hours.    Dispense:  14 tablet    Refill:  0      *This clinic note was created using Scientist, clinical (histocompatibility and immunogenetics). Therefore, there may be occasional mistakes despite careful proofreading. ?     Van Knee, MD 08/22/24 1419     [1]  Social History Tobacco Use   Smoking status: Former    Current packs/day: 0.00    Types: Cigarettes    Quit date: 2019    Years since quitting: 6.9   Smokeless tobacco: Never   Tobacco comments:    socially  Vaping Use   Vaping status: Never Used  Substance Use Topics   Alcohol use: Yes    Alcohol/week: 2.0 standard drinks of alcohol    Types: 2 Cans of beer per week    Comment: socially    Drug use: Not Currently    Types: Marijuana  [2] No current facility-administered medications for this encounter.  Current Outpatient Medications:    allopurinol  (ZYLOPRIM ) 100 MG tablet, TAKE 1 TABLET BY MOUTH EVERY DAY, Disp: 90 tablet, Rfl: 0   amLODipine  (NORVASC ) 5 MG tablet, TAKE 1 TABLET BY MOUTH EVERY DAY, Disp: 90 tablet, Rfl: 2   amoxicillin -clavulanate (AUGMENTIN ) 875-125 MG tablet, Take 1 tablet by mouth every 12 (twelve) hours., Disp: 14 tablet, Rfl: 0   atenolol   (TENORMIN ) 50 MG tablet, TAKE 1 TABLET BY MOUTH EVERY DAY, Disp: 90 tablet, Rfl: 2   chlorpheniramine-HYDROcodone  (TUSSIONEX) 10-8 MG/5ML, Take 5 mLs by mouth every 12 (twelve) hours as needed for cough., Disp: 60 mL, Rfl: 0   fluticasone  (FLONASE ) 50 MCG/ACT nasal spray, Place 2 sprays into both nostrils daily., Disp: 16 g, Rfl: 6   hydrOXYzine (ATARAX) 25 MG tablet, Take 25 mg by mouth daily., Disp: , Rfl:    ipratropium (ATROVENT ) 0.06 % nasal spray, Place 2 sprays into both nostrils 4 (four) times daily., Disp: 15 mL, Rfl: 0   ofloxacin  (FLOXIN ) 0.3 % OTIC solution, Place 5 drops into the right ear 2 (two) times daily. X 7 days, Disp: 10 mL, Rfl: 0   ondansetron  (ZOFRAN -ODT) 8 MG disintegrating tablet, 1/2- 1 tablet q 8 hr prn nausea, vomiting, Disp: 20 tablet, Rfl: 0   orphenadrine  (NORFLEX ) 100 MG tablet, Take 1 tablet (100 mg total) by mouth 2 (two) times daily., Disp: 10 tablet, Rfl: 0   PARoxetine  (PAXIL ) 20 MG tablet, TAKE 1 TABLET BY MOUTH EVERY DAY, Disp: 90 tablet, Rfl: 1   traZODone  (DESYREL ) 50 MG tablet, TAKE 1-2 TABLETS BY MOUTH AT BEDTIME., Disp: 180 tablet, Rfl: 2   triamcinolone ointment (KENALOG) 0.1 %, Apply 1 Application topically 2 (two) times daily., Disp: , Rfl:    valsartan -hydrochlorothiazide  (DIOVAN -HCT) 80-12.5 MG tablet, TAKE 1 TABLET BY MOUTH EVERY DAY, Disp: 90 tablet, Rfl: 1   levonorgestrel  (MIRENA ) 20 MCG/24HR IUD, 1 Intra Uterine Device (1 each total) by Intrauterine route once for 1 dose., Disp: 1 each, Rfl: 0 [3]  Allergies Allergen Reactions   Hydrocodone  Itching    Pt can take if benadryl  given   Oxycodone  Itching    Pt can take this if benadryl  is given      Julyana Woolverton, MD  08/22/24 1419 ° °
